# Patient Record
Sex: Female | Born: 1990 | Race: White | Hispanic: No | Marital: Married | Smoking: Never smoker
Health system: Southern US, Community
[De-identification: ages and names within clinical notes are randomized; demographics above are authoritative.]

## PROBLEM LIST (undated history)

## (undated) DIAGNOSIS — Q402 Other specified congenital malformations of stomach: Secondary | ICD-10-CM

## (undated) HISTORY — DX: Other specified congenital malformations of stomach: Q40.2

## (undated) HISTORY — PX: OTHER SURGICAL HISTORY: SHX169

---

## 2020-01-07 NOTE — L&D Delivery Note (Signed)
Delivery Note  0125 In room to see patient, reports increased pelvic pressure with contractions. Coached maternal pushing efforts initiated. Room prepared for second stage.   Spontaneous rupture of membranes at 0130, moderate amount of clear fluid.   Spontaneous vaginal birth of liveborn female infant in left occiput anterior position over intact perineum at 0159. Infant immediately to maternal abdomen. Delayed cord clamping of three (3) vessel cord and tube of cord blood collected. APGARs: 8, 9. Weight pending. Receiving nurse present at bedside for birth.   Patient declines postpartum pitocin. Uterus firm. Spontaneous delivery of intact placenta at 0210. Uterus firm. Rubra small. Perineum intact, swollen. Vault check completed under epidural anesthesia. QBL: 480 ml. Counts correct x 2.   Initiate routine postpartum care and orders. Mom to postpartum.  Baby to Couplet care / Skin to Skin.  FOB present at bedside.    Serafina Royals, CNM [facility], [facility] 07/18/2020, 2:29 AM

## 2020-01-19 ENCOUNTER — Telehealth: Payer: Self-pay

## 2020-01-19 NOTE — Telephone Encounter (Signed)
Informed patient of no visitor policy due to rise in positive COVID cases. Patient was in agreement.

## 2020-01-20 ENCOUNTER — Encounter: Payer: Self-pay | Admitting: Certified Nurse Midwife

## 2020-01-20 ENCOUNTER — Ambulatory Visit (INDEPENDENT_AMBULATORY_CARE_PROVIDER_SITE_OTHER): Payer: 59 | Admitting: Certified Nurse Midwife

## 2020-01-20 ENCOUNTER — Other Ambulatory Visit: Payer: Self-pay

## 2020-01-20 VITALS — BP 132/74 | HR 84 | Ht 64.0 in | Wt 136.8 lb

## 2020-01-20 DIAGNOSIS — N912 Amenorrhea, unspecified: Secondary | ICD-10-CM

## 2020-01-20 DIAGNOSIS — Z3201 Encounter for pregnancy test, result positive: Secondary | ICD-10-CM

## 2020-01-20 LAB — POCT URINE PREGNANCY: Preg Test, Ur: POSITIVE — AB

## 2020-01-20 NOTE — Patient Instructions (Signed)
WHAT OB PATIENTS CAN EXPECT   Confirmation of pregnancy and ultrasound ordered if medically indicated-[redacted] weeks gestation  New OB (NOB) intake with nurse and New OB (NOB) labs- [redacted] weeks gestation  New OB (NOB) physical examination with provider- 11/[redacted] weeks gestation  Flu vaccine-[redacted] weeks gestation  Anatomy scan-[redacted] weeks gestation  Glucose tolerance test, blood work to test for anemia, T-dap vaccine-[redacted] weeks gestation  Vaginal swabs/cultures-STD/Group B strep-[redacted] weeks gestation  Appointments every 4 weeks until 28 weeks  Every 2 weeks from 28 weeks until 36 weeks  Weekly visits from 36 weeks until delivery    Common Medications Safe in Pregnancy  Acne:      Constipation:  Benzoyl Peroxide     Colace  Clindamycin      Dulcolax Suppository  Topica Erythromycin     Fibercon  Salicylic Acid      Metamucil         Miralax AVOID:        Senakot   Accutane    Cough:  Retin-A       Cough Drops  Tetracycline      Phenergan w/ Codeine if Rx  Minocycline      Robitussin (Plain & DM)  Antibiotics:     Crabs/Lice:  Ceclor       RID  Cephalosporins    AVOID:  E-Mycins      Kwell  Keflex  Macrobid/Macrodantin   Diarrhea:  Penicillin      Kao-Pectate  Zithromax      Imodium AD         PUSH FLUIDS AVOID:       Cipro     Fever:  Tetracycline      Tylenol (Regular or Extra  Minocycline       Strength)  Levaquin      Extra Strength-Do not          Exceed 8 tabs/24 hrs Caffeine:        <298m/day (equiv. To 1 cup of coffee or  approx. 3 12 oz sodas)         Gas: Cold/Hayfever:       Gas-X  Benadryl      Mylicon  Claritin       Phazyme  **Claritin-D        Chlor-Trimeton    Headaches:  Dimetapp      ASA-Free Excedrin  Drixoral-Non-Drowsy     Cold Compress  Mucinex (Guaifenasin)     Tylenol (Regular or Extra  Sudafed/Sudafed-12 Hour     Strength)  **Sudafed PE Pseudoephedrine   Tylenol Cold & Sinus     Vicks Vapor Rub  Zyrtec  **AVOID if Problems With Blood  Pressure         Heartburn: Avoid lying down for at least 1 hour after meals  Aciphex      Maalox     Rash:  Milk of Magnesia     Benadryl    Mylanta       1% Hydrocortisone Cream  Pepcid  Pepcid Complete   Sleep Aids:  Prevacid      Ambien   Prilosec       Benadryl  Rolaids       Chamomile Tea  Tums (Limit 4/day)     Unisom         Tylenol PM         Warm milk-add vanilla or  Hemorrhoids:       Sugar for taste  Anusol/Anusol H.C.  (RX:  Analapram 2.5%)  Sugar Substitutes:  Hydrocortisone OTC     Ok in moderation  Preparation H      Tucks        Vaseline lotion applied to tissue with wiping    Herpes:     Throat:  Acyclovir      Oragel  Famvir  Valtrex     Vaccines:         Flu Shot Leg Cramps:       *Gardasil  Benadryl      Hepatitis A         Hepatitis B Nasal Spray:       Pneumovax  Saline Nasal Spray     Polio Booster         Tetanus Nausea:       Tuberculosis test or PPD  Vitamin B6 25 mg TID   AVOID:    Dramamine      *Gardasil  Emetrol       Live Poliovirus  Ginger Root 250 mg QID    MMR (measles, mumps &  High Complex Carbs @ Bedtime    rebella)  Sea Bands-Accupressure    Varicella (Chickenpox)  Unisom 1/2 tab TID     *No known complications           If received before Pain:         Known pregnancy;   Darvocet       Resume series after  Lortab        Delivery  Percocet    Yeast:   Tramadol      Femstat  Tylenol 3      Gyne-lotrimin  Ultram       Monistat  Vicodin           MISC:         All Sunscreens           Hair Coloring/highlights          Insect Repellant's          (Including DEET)         Mystic Tans   Second Trimester of Pregnancy  The second trimester of pregnancy is from week 13 through week 27. This is also called months 4 through 6 of pregnancy. This is often the time when you feel your best. During the second trimester:  Morning sickness is less or has stopped.  You may have more energy.  You may feel hungry more  often. At this time, your unborn baby (fetus) is growing very fast. At the end of the sixth month, the unborn baby may be up to 12 inches long and weigh about 1 pounds. You will likely start to feel the baby move between 16 and 20 weeks of pregnancy. Body changes during your second trimester Your body continues to go through many changes during this time. The changes vary and generally return to normal after the baby is born. Physical changes  You will gain more weight.  You may start to get stretch marks on your hips, belly (abdomen), and breasts.  Your breasts will grow and may hurt.  Dark spots or blotches may develop on your face.  A dark line from your belly button to the pubic area (linea nigra) may appear.  You may have changes in your hair. Health changes  You may have headaches.  You may have heartburn.  You may have trouble pooping (constipation).  You may have hemorrhoids or swollen, bulging veins (varicose veins).  Your   gums may bleed.  You may pee (urinate) more often.  You may have back pain. Follow these instructions at home: Medicines  Take over-the-counter and prescription medicines only as told by your doctor. Some medicines are not safe during pregnancy.  Take a prenatal vitamin that contains at least 600 micrograms (mcg) of folic acid. Eating and drinking  Eat healthy meals that include: ? Fresh fruits and vegetables. ? Whole grains. ? Good sources of protein, such as meat, eggs, or tofu. ? Low-fat dairy products.  Avoid raw meat and unpasteurized juice, milk, and cheese.  You may need to take these actions to prevent or treat trouble pooping: ? Drink enough fluids to keep your pee (urine) pale yellow. ? Eat foods that are high in fiber. These include beans, whole grains, and fresh fruits and vegetables. ? Limit foods that are high in fat and sugar. These include fried or sweet foods. Activity  Exercise only as told by your doctor. Most  people can do their usual exercise during pregnancy. Try to exercise for 30 minutes at least 5 days a week.  Stop exercising if you have pain or cramps in your belly or lower back.  Do not exercise if it is too hot or too humid, or if you are in a place of great height (high altitude).  Avoid heavy lifting.  If you choose to, you may have sex unless your doctor tells you not to. Relieving pain and discomfort  Wear a good support bra if your breasts are sore.  Take warm water baths (sitz baths) to soothe pain or discomfort caused by hemorrhoids. Use hemorrhoid cream if your doctor approves.  Rest with your legs raised (elevated) if you have leg cramps or low back pain.  If you develop bulging veins in your legs: ? Wear support hose as told by your doctor. ? Raise your feet for 15 minutes, 3-4 times a day. ? Limit salt in your food. Safety  Wear your seat belt at all times when you are in a car.  Talk with your doctor if someone is hurting you or yelling at you a lot. Lifestyle  Do not use hot tubs, steam rooms, or saunas.  Do not douche. Do not use tampons or scented sanitary pads.  Avoid cat litter boxes and soil used by cats. These carry germs that can harm your baby and can cause a loss of your baby by miscarriage or stillbirth.  Do not use herbal medicines, illegal drugs, or medicines that are not approved by your doctor. Do not drink alcohol.  Do not smoke or use any products that contain nicotine or tobacco. If you need help quitting, ask your doctor. General instructions  Keep all follow-up visits. This is important.  Ask your doctor about local prenatal classes.  Ask your doctor about the right foods to eat or for help finding a counselor. Where to find more information  American Pregnancy Association: americanpregnancy.org  American College of Obstetricians and Gynecologists: www.acog.org  Office on Women's Health: womenshealth.gov/pregnancy Contact a doctor  if:  You have a headache that does not go away when you take medicine.  You have changes in how you see, or you see spots in front of your eyes.  You have mild cramps, pressure, or pain in your lower belly.  You continue to feel like you may vomit (nauseous), you vomit, or you have watery poop (diarrhea).  You have bad-smelling fluid coming from your vagina.  You have pain when you   pee or your pee smells bad.  You have very bad swelling of your face, hands, ankles, feet, or legs.  You have a fever. Get help right away if:  You are leaking fluid from your vagina.  You have spotting or bleeding from your vagina.  You have very bad belly cramping or pain.  You have trouble breathing.  You have chest pain.  You faint.  You have not felt your baby move for the time period told by your doctor.  You have new or increased pain, swelling, or redness in an arm or leg. Summary  The second trimester of pregnancy is from week 13 through week 27 (months 4 through 6).  Eat healthy meals.  Exercise as told by your doctor. Most people can do their usual exercise during pregnancy.  Do not use herbal medicines, illegal drugs, or medicines that are not approved by your doctor. Do not drink alcohol.  Call your doctor if you get sick or if you notice anything unusual about your pregnancy. This information is not intended to replace advice given to you by your health care provider. Make sure you discuss any questions you have with your health care provider. Document Revised: 06/01/2019 Document Reviewed: 04/07/2019 Elsevier Patient Education  2021 Elsevier Inc.  

## 2020-01-20 NOTE — Progress Notes (Signed)
GYN ENCOUNTER NOTE  Subjective:       Tammy Burgess is a 30 y.o. [phone] female here for pregnancy confirmation.   Nausea and fatigue with are resolving in the second trimester of pregnancy.   Taking a prenatal vitamin with folic acid and DHA.   Relocated here from [place]. History of two (2) previous home birth with midwife. Insurance will only cover a hospital birth in [place].   Denies difficulty breathing or respiratory distress, chest pain, abdominal pain, dysuria, and leg pain or swelling.    Gynecologic History  Patient's last menstrual period was 10/08/2019.   End of ovulation date: 10/23/2019  Gestational age: 45 weeks 6 days  Estimated of birth: 07/16/2020  Contraception: none  Obstetric History  OB History  Gravida Para Term Preterm AB Living  4 2 2   1 2   SAB IAB Ectopic Multiple Live Births  1       2    # Outcome Date GA Lbr Len/2nd Weight Sex Delivery Anes PTL Lv  4 Current           3 Term 04/09/18   7 lb 8 oz (3.402 kg) M Vag-Spont  N LIV  2 Term 09/14/15   6 lb 11 oz (3.033 kg)  Vag-Spont  N LIV  1 SAB 12/03/14            Past Medical History:  Diagnosis Date  . Pyloric atresia     History reviewed. No pertinent surgical history.  Current Outpatient Medications on File Prior to Visit  Medication Sig Dispense Refill  . Magnesium Oxide (MAG-CAPS PO) Take by mouth.    . Prenatal Vit-Fe Fumarate-FA (MULTIVITAMIN-PRENATAL) 27-0.8 MG TABS tablet Take 1 tablet by mouth daily at 12 noon.     No current facility-administered medications on file prior to visit.    Allergies  Allergen Reactions  . Latex Rash    Social History   Socioeconomic History  . Marital status: Married    Spouse name: Not on file  . Number of children: Not on file  . Years of education: Not on file  . Highest education level: Not on file  Occupational History  . Not on file  Tobacco Use  . Smoking status: Never Smoker  . Smokeless tobacco: Never Used   Substance and Sexual Activity  . Alcohol use: Not Currently  . Drug use: Never  . Sexual activity: Yes    Partners: Male    Birth control/protection: None  Other Topics Concern  . Not on file  Social History Narrative  . Not on file   Social Determinants of Health   Financial Resource Strain: Not on file  Food Insecurity: Not on file  Transportation Needs: Not on file  Physical Activity: Not on file  Stress: Not on file  Social Connections: Not on file  Intimate Partner Violence: Not on file    Family History  Problem Relation Age of Onset  . Hypertension Mother     The following portions of the patient's history were reviewed and updated as appropriate: allergies, current medications, past family history, past medical history, past social history, past surgical history and problem list.  Review of Systems  ROS negative except as noted above. Information obtained from patient.   Objective:   BP (!) 152/70   Pulse 84   Ht 5\' 4"  (1.626 m)   Wt 136 lb 12.8 oz (62.1 kg)   LMP 10/08/2019   BMI 23.48 kg/m  CONSTITUTIONAL: Well-developed, well-nourished female in no acute distress.   ABDOMEN: FHR 145 bpm, four (4) fingerbreadths below the umbilicus.  Recent Results (from the past 2160 hour(s))  POCT urine pregnancy     Status: Abnormal   Collection Time: 01/20/20  3:50 PM  Result Value Ref Range   Preg Test, Ur Positive (A) Negative   Assessment:   1. Amenorrhea  - POCT urine pregnancy  2. Positive pregnancy test   Plan:   Second trimester education, see AVS.   Discussed practice and hospital policies.   Reviewed red flag symptoms and when to call.   RTC x 1-2 week for intake and NOB PE or sooner if needed.    Serafina Royals, CNM [facility], [facility]

## 2020-02-10 ENCOUNTER — Other Ambulatory Visit: Payer: Self-pay

## 2020-02-10 ENCOUNTER — Ambulatory Visit (INDEPENDENT_AMBULATORY_CARE_PROVIDER_SITE_OTHER): Payer: 59 | Admitting: Certified Nurse Midwife

## 2020-02-10 VITALS — BP 134/83 | HR 85 | Ht 64.0 in | Wt 139.7 lb

## 2020-02-10 DIAGNOSIS — Z113 Encounter for screening for infections with a predominantly sexual mode of transmission: Secondary | ICD-10-CM | POA: Diagnosis not present

## 2020-02-10 DIAGNOSIS — Z0283 Encounter for blood-alcohol and blood-drug test: Secondary | ICD-10-CM | POA: Diagnosis not present

## 2020-02-10 DIAGNOSIS — Z3482 Encounter for supervision of other normal pregnancy, second trimester: Secondary | ICD-10-CM

## 2020-02-10 NOTE — Progress Notes (Signed)
Tammy Burgess presents for NOB nurse intake visit. Pregnancy confirmation done at [facility], 01/20/2020 , with Gunnar Bulla, CNM.  G 4.  P 2012.  LMP 10/08/2019.  EDD 07/14/2020.  Ga [redacted]w[redacted]d . Pregnancy education material explained and given.  0 cats in the home.  NOB labs ordered. BMI greater than 30. TSH/HbgA1c. Sickle cell order due to race. HIV and drug screen explained and ordered. Genetic screening discussed. Genetic testing; Unsure. Pt to discuss genetic testing with provider. PNV encouraged. Pt to follow up with provider in 1 weeks for NOB physical.  FMLA; [facility] Financial Policy; Drug and HIV screening. Pt signed forms and voiced stated that she understood. Pt would like to discuss if she needed to complete some of the labs work due to she was going to have to pay for them out of pocket. Pt was advised to speak with provider during her NOB PE. All NOB labs have been charted and pended.

## 2020-02-10 NOTE — Progress Notes (Signed)
I have reviewed the record and concur with patient management and plan of care.    Tammy Burgess, CNM [facility], [facility] 02/10/20 5:30 PM

## 2020-02-10 NOTE — Patient Instructions (Signed)
[phone] and Pregnancy  Pregnant and recently pregnant women should take steps to stay healthy, including . getting a COVID-19 vaccine  . following guidelines from health officials for when to wear a mask and take other steps to prevent infection . keeping your prenatal and postpartum care visits . talking with an OB-GYN or other health care professional if you have any questions about your health or COVID-19 . calling 911 or going to the hospital right away if you need emergency health care   If you think you may have been exposed to the coronavirus and have a fever or cough, call your ob-gyn or other health care professional for advice. If you have emergency warning signs, call 911 or go to the hospital right away. Emergency warning signs include the following: . Having a hard time breathing or shortness of breath (more than what has been normal for you during pregnancy) . Ongoing pain or pressure in the chest . Sudden confusion . Being unable to respond to others . Blue lips or face . Decreased fetal movement/absent of fetal movement . Fever greater than 100.4  If you go to the hospital, try to call ahead to let them know you are coming so they can prepare. If you have other symptoms that worry you, call your OB-GYN or 911.   If you are diagnosed with COVID-19, follow the advice from the [facility] and your ob-gyn or other health care professional. The current CDC advice for all people with COVID-19 includes the following: . Stay home except to get medical care. Avoid public transportation. Marland Kitchen Speak with your health care team over the phone before going to their office. Get medical care right away if you feel worse or think it's an emergency. . Separate yourself from other people in your home. . Wear a face mask when you are around other people and when you go to get medical care. . Use the safe medication list to treat the symptoms i.e., cough, congestion, sore throat, fever. . If you are  having nausea and are unable to hold down liquids or food contact the office as we can prescribe medication. . Stay hydrate. Frequent sips of water, broth, ice chips, and popsicle. . Small bland meals.  . Hand hygiene- Wash hands frequently and/or use hand sanitizer.  . Wipe down surfaces.    For additional information please visit the web site below.    [email or web address]    WHAT OB PATIENTS CAN EXPECT   Confirmation of pregnancy and ultrasound ordered if medically indicated-[redacted] weeks gestation  New OB (NOB) intake with nurse and New OB (NOB) labs- [redacted] weeks gestation  New OB (NOB) physical examination with provider- 11/[redacted] weeks gestation  Flu vaccine-[redacted] weeks gestation  Anatomy scan-[redacted] weeks gestation  Glucose tolerance test, blood work to test for anemia, T-dap vaccine-[redacted] weeks gestation  Vaginal swabs/cultures-STD/Group B strep-[redacted] weeks gestation  Appointments every 4 weeks until 28 weeks  Every 2 weeks from 28 weeks until 36 weeks  Weekly visits from 36 weeks until delivery  Second Trimester of Pregnancy  The second trimester of pregnancy is from week 13 through week 27. This is also called months 4 through 6 of pregnancy. This is often the time when you feel your best. During the second trimester:  Morning sickness is less or has stopped.  You may have more energy.  You may feel hungry more often. At this time, your unborn baby (fetus) is growing very fast. At the end of the sixth month,  the unborn baby may be up to 12 inches long and weigh about 1 pounds. You will likely start to feel the baby move between 16 and 20 weeks of pregnancy. Body changes during your second trimester Your body continues to go through many changes during this time. The changes vary and generally return to normal after the baby is born. Physical changes  You will gain more weight.  You may start to get stretch marks on your hips, belly (abdomen), and breasts.  Your breasts will  grow and may hurt.  Dark spots or blotches may develop on your face.  A dark line from your belly button to the pubic area (linea nigra) may appear.  You may have changes in your hair. Health changes  You may have headaches.  You may have heartburn.  You may have trouble pooping (constipation).  You may have hemorrhoids or swollen, bulging veins (varicose veins).  Your gums may bleed.  You may pee (urinate) more often.  You may have back pain. Follow these instructions at home: Medicines  Take over-the-counter and prescription medicines only as told by your doctor. Some medicines are not safe during pregnancy.  Take a prenatal vitamin that contains at least 600 micrograms (mcg) of folic acid. Eating and drinking  Eat healthy meals that include: ? Fresh fruits and vegetables. ? Whole grains. ? Good sources of protein, such as meat, eggs, or tofu. ? Low-fat dairy products.  Avoid raw meat and unpasteurized juice, milk, and cheese.  You may need to take these actions to prevent or treat trouble pooping: ? Drink enough fluids to keep your pee (urine) pale yellow. ? Eat foods that are high in fiber. These include beans, whole grains, and fresh fruits and vegetables. ? Limit foods that are high in fat and sugar. These include fried or sweet foods. Activity  Exercise only as told by your doctor. Most people can do their usual exercise during pregnancy. Try to exercise for 30 minutes at least 5 days a week.  Stop exercising if you have pain or cramps in your belly or lower back.  Do not exercise if it is too hot or too humid, or if you are in a place of great height (high altitude).  Avoid heavy lifting.  If you choose to, you may have sex unless your doctor tells you not to. Relieving pain and discomfort  Wear a good support bra if your breasts are sore.  Take warm water baths (sitz baths) to soothe pain or discomfort caused by hemorrhoids. Use hemorrhoid cream if  your doctor approves.  Rest with your legs raised (elevated) if you have leg cramps or low back pain.  If you develop bulging veins in your legs: ? Wear support hose as told by your doctor. ? Raise your feet for 15 minutes, 3-4 times a day. ? Limit salt in your food. Safety  Wear your seat belt at all times when you are in a car.  Talk with your doctor if someone is hurting you or yelling at you a lot. Lifestyle  Do not use hot tubs, steam rooms, or saunas.  Do not douche. Do not use tampons or scented sanitary pads.  Avoid cat litter boxes and soil used by cats. These carry germs that can harm your baby and can cause a loss of your baby by miscarriage or stillbirth.  Do not use herbal medicines, illegal drugs, or medicines that are not approved by your doctor. Do not drink alcohol.  Do not smoke or use any products that contain nicotine or tobacco. If you need help quitting, ask your doctor. General instructions  Keep all follow-up visits. This is important.  Ask your doctor about local prenatal classes.  Ask your doctor about the right foods to eat or for help finding a counselor. Where to find more information  American Pregnancy Association: americanpregnancy.org  [employer]: www.acog.org  Office on [employer]: KeywordPortfolios.com.br Contact a doctor if:  You have a headache that does not go away when you take medicine.  You have changes in how you see, or you see spots in front of your eyes.  You have mild cramps, pressure, or pain in your lower belly.  You continue to feel like you may vomit (nauseous), you vomit, or you have watery poop (diarrhea).  You have bad-smelling fluid coming from your vagina.  You have pain when you pee or your pee smells bad.  You have very bad swelling of your face, hands, ankles, feet, or legs.  You have a fever. Get help right away if:  You are leaking fluid from your  vagina.  You have spotting or bleeding from your vagina.  You have very bad belly cramping or pain.  You have trouble breathing.  You have chest pain.  You faint.  You have not felt your baby move for the time period told by your doctor.  You have new or increased pain, swelling, or redness in an arm or leg. Summary  The second trimester of pregnancy is from week 13 through week 27 (months 4 through 6).  Eat healthy meals.  Exercise as told by your doctor. Most people can do their usual exercise during pregnancy.  Do not use herbal medicines, illegal drugs, or medicines that are not approved by your doctor. Do not drink alcohol.  Call your doctor if you get sick or if you notice anything unusual about your pregnancy. This information is not intended to replace advice given to you by your health care provider. Make sure you discuss any questions you have with your health care provider. Document Revised: 06/01/2019 Document Reviewed: 04/07/2019 Elsevier Patient Education  2021 [employer]. How a Baby Grows During Pregnancy Pregnancy begins when a female's sperm enters a female's egg. This is called fertilization. Fertilization usually happens in one of the fallopian tubes that connect the ovaries to the uterus. The fertilized egg moves down the fallopian tube to the uterus. Once it reaches the uterus, it implants into the lining of the uterus and begins to grow. For the first 8 weeks, the fertilized egg is called an embryo. After 8 weeks, it is called a fetus. As the fetus continues to grow, it receives oxygen and nutrients through the placenta, which is an organ that grows to support the developing baby. The placenta is the life support system for the baby. It provides oxygen and nutrition and removes waste. How long does a typical pregnancy last? A pregnancy usually lasts 280 days, or about 40 weeks. Pregnancy is divided into three periods of growth, also called trimesters:  First  trimester: 0-12 weeks.  Second trimester: 13-27 weeks.  Third trimester: 28-40 weeks. The day when your baby is ready to be born (full term) is your estimated date of delivery. However, most babies are not born on their estimated date of delivery. How does my baby develop month by month? First month  The fertilized egg attaches to the inside of the uterus.  Some cells will form the placenta. Others will form the fetus.  The arms, legs, brain, spinal cord, lungs, and heart begin to develop.  At the end of the first month, the heart begins to beat. Second month  The bones, inner ear, eyelids, hands, and feet form.  The genitals develop.  By the end of 8 weeks, all major organs are developing. Third month  All of the internal organs are forming.  Teeth develop below the gums.  Bones and muscles begin to grow. The spine can flex.  The skin is transparent.  Fingernails and toenails begin to form.  Arms and legs continue to grow longer, and hands and feet develop.  The fetus is about 3 inches (7.6 cm) long. Fourth month  The placenta is completely formed.  The external sex organs, neck, outer ear, eyebrows, eyelids, and fingernails are formed.  The fetus can hear, swallow, and move its arms and legs.  The kidneys begin to produce urine.  The skin is covered with a white, waxy coating (vernix) and very fine hair (lanugo). Fifth month  The fetus moves around more and can be felt for the first time (quickening).  The fetus starts to sleep and wake up and may begin to suck a finger.  The nails grow to the end of the fingers.  The organ in the digestive system that makes bile (gallbladder) functions and helps to digest nutrients.  If the fetus is a female, eggs are present in the ovaries. If the fetus is a female, testicles start to move down into the scrotum. Sixth month  The lungs are formed.  The eyes open. The brain continues to develop.  Your baby has  fingerprints and toe prints. Your baby's hair grows thicker.  At the end of the second trimester, the fetus is about 9 inches (22.9 cm) long. Seventh month  The fetus kicks and stretches.  The eyes are developed enough to sense changes in light.  The hands can make a grasping motion.  The fetus responds to sound. Eighth month  Most organs and body systems are fully developed and functioning.  Bones harden, and taste buds develop. The fetus may hiccup.  Certain areas of the brain are still developing. The skull remains soft. Ninth month  The fetus gains about  lb (0.23 kg) each week.  The lungs are fully developed.  Patterns of sleep develop.  The fetus's head typically moves into a head-down position (vertex) in the uterus to prepare for birth.  The fetus weighs 6-9 lb (2.72-4.08 kg) and is 19-20 inches (48.26-50.8 cm) long.   How do I know if my baby is developing well? Always talk with your health care provider about any concerns that you may have about your pregnancy and your baby. At each prenatal visit, your health care provider will do several different tests to check on your health and keep track of your baby's development. These include:  Fundal height and position. To do this, your health care provider will: ? Measure your growing belly from your pubic bone to the top of the uterus using a tape measure. ? Feel your belly to determine your baby's position.  Heartbeat. An ultrasound in the first trimester can confirm pregnancy and show a heartbeat, depending on how far along you are. Your health care provider will check your baby's heart rate at every prenatal visit. You will also have a second trimester ultrasound to check your baby's development. Follow these instructions at home:  Take prenatal vitamins as told by your health care provider. These include vitamins such as folic acid, iron, calcium, and vitamin D. They are important for healthy development.  Take  over-the-counter and prescription medicines only as told by your health care provider.  Keep all follow-up visits. This is important. Follow-up visits include prenatal care and screening tests. Summary  A pregnancy usually lasts 280 days, or about 40 weeks. Pregnancy is divided into three periods of growth, also called trimesters.  Your health care provider will monitor your baby's growth and development throughout your pregnancy.  Follow your health care provider's recommendations about taking prenatal vitamins and medicines during your pregnancy.  Talk with your health care provider if you have any concerns about your pregnancy or your developing baby. This information is not intended to replace advice given to you by your health care provider. Make sure you discuss any questions you have with your health care provider. Document Revised: 06/01/2019 Document Reviewed: 04/07/2019 Elsevier Patient Education  2021 [employer]. Commonly Asked Questions During Pregnancy  Cats: A parasite can be excreted in cat feces.  To avoid exposure you need to have another person empty the little box.  If you must empty the litter box you will need to wear gloves.  Wash your hands after handling your cat.  This parasite can also be found in raw or undercooked meat so this should also be avoided.  Colds, Sore Throats, Flu: Please check your medication sheet to see what you can take for symptoms.  If your symptoms are unrelieved by these medications please call the office.  Dental Work: Most any dental work Investment banker, corporate recommends is permitted.  X-rays should only be taken during the first trimester if absolutely necessary.  Your abdomen should be shielded with a lead apron during all x-rays.  Please notify your provider prior to receiving any x-rays.  Novocaine is fine; gas is not recommended.  If your dentist requires a note from [place] prior to dental work please call the office and we will provide one for  you.  Exercise: Exercise is an important part of staying healthy during your pregnancy.  You may continue most exercises you were accustomed to prior to pregnancy.  Later in your pregnancy you will most likely notice you have difficulty with activities requiring balance like riding a bicycle.  It is important that you listen to your body and avoid activities that put you at a higher risk of falling.  Adequate rest and staying well hydrated are a must!  If you have questions about the safety of specific activities ask your provider.    Exposure to Children with illness: Try to avoid obvious exposure; report any symptoms to [place] when noted,  If you have chicken pos, red measles or mumps, you should be immune to these diseases.   Please do not take any vaccines while pregnant unless you have checked with your OB provider.  Fetal Movement: After 28 weeks we recommend you do "kick counts" twice daily.  Lie or sit down in a calm quiet environment and count your baby movements "kicks".  You should feel your baby at least 10 times per hour.  If you have not felt 10 kicks within the first hour get up, walk around and have something sweet to eat or drink then repeat for an additional hour.  If count remains less than 10 per hour notify your provider.  Fumigating: Follow your pest control agent's advice as to how long to  stay out of your home.  Ventilate the area well before re-entering.  Hemorrhoids:   Most over-the-counter preparations can be used during pregnancy.  Check your medication to see what is safe to use.  It is important to use a stool softener or fiber in your diet and to drink lots of liquids.  If hemorrhoids seem to be getting worse please call the office.   Hot Tubs:  Hot tubs Jacuzzis and saunas are not recommended while pregnant.  These increase your internal body temperature and should be avoided.  Intercourse:  Sexual intercourse is safe during pregnancy as long as you are comfortable, unless  otherwise advised by your provider.  Spotting may occur after intercourse; report any bright red bleeding that is heavier than spotting.  Labor:  If you know that you are in labor, please go to the hospital.  If you are unsure, please call the office and let us help you decide what to do.  Lifting, straining, etc:  If your job requires heavy lifting or straining please check with your provider for any limitations.  Generally, you should not lift items heavier than that you can lift simply with your hands and arms (no back muscles)  Painting:  Paint fumes do not harm your pregnancy, but may make you ill and should be avoided if possible.  Latex or water based paints have less odor than oils.  Use adequate ventilation while painting.  Permanents & Hair Color:  Chemicals in hair dyes are not recommended as they cause increase hair dryness which can increase hair loss during pregnancy.  " Highlighting" and permanents are allowed.  Dye may be absorbed differently and permanents may not hold as well during pregnancy.  Sunbathing:  Use a sunscreen, as skin burns easily during pregnancy.  Drink plenty of fluids; avoid over heating.  Tanning Beds:  Because their possible side effects are still unknown, tanning beds are not recommended.  Ultrasound Scans:  Routine ultrasounds are performed at approximately 20 weeks.  You will be able to see your baby's general anatomy an if you would like to know the gender this can usually be determined as well.  If it is questionable when you conceived you may also receive an ultrasound early in your pregnancy for dating purposes.  Otherwise ultrasound exams are not routinely performed unless there is a medical necessity.  Although you can request a scan we ask that you pay for it when conducted because insurance does not cover " patient request" scans.  Work: If your pregnancy proceeds without complications you may work until your due date, unless your physician or employer  advises otherwise.  Round Ligament Pain/Pelvic Discomfort:  Sharp, shooting pains not associated with bleeding are fairly common, usually occurring in the second trimester of pregnancy.  They tend to be worse when standing up or when you remain standing for long periods of time.  These are the result of pressure of certain pelvic ligaments called "round ligaments".  Rest, Tylenol and heat seem to be the most effective relief.  As the womb and fetus grow, they rise out of the pelvis and the discomfort improves.  Please notify the office if your pain seems different than that described.  It may represent a more serious condition.  Common Medications Safe in Pregnancy  Acne:      Constipation:  Benzoyl Peroxide     Colace  Clindamycin      Dulcolax Suppository  Topica Erythromycin     Fibercon  Metamucil         Miralax AVOID:        Senakot   Accutane    Cough:  Retin-A       Cough Drops  Tetracycline      Phenergan w/ Codeine if Rx  Minocycline      Robitussin (Plain & DM)  Antibiotics:     Crabs/Lice:  Ceclor       RID  Cephalosporins    AVOID:  E-Mycins      Kwell  Keflex  Macrobid/Macrodantin   Diarrhea:  Penicillin      Kao-Pectate  Zithromax      Imodium AD         PUSH FLUIDS AVOID:       Cipro     Fever:  Tetracycline      Tylenol (Regular or Extra  Minocycline       Strength)  Levaquin      Extra Strength-Do not          Exceed 8 tabs/24 hrs Caffeine:        <200mg/day (equiv. To 1 cup of coffee or  approx. 3 12 oz sodas)         Gas: Cold/Hayfever:       Gas-X  Benadryl      Mylicon  Claritin       Phazyme  **Claritin-D        Chlor-Trimeton    Headaches:  Dimetapp      ASA-Free Excedrin  Drixoral-Non-Drowsy     Cold Compress  Mucinex (Guaifenasin)     Tylenol (Regular or Extra  Sudafed/Sudafed-12 Hour     Strength)  **Sudafed PE Pseudoephedrine   Tylenol Cold & Sinus     Vicks Vapor Rub  Zyrtec  **AVOID if Problems With Blood  Pressure         Heartburn: Avoid lying down for at least 1 hour after meals  Aciphex      Maalox     Rash:  Milk of Magnesia     Benadryl    Mylanta       1% Hydrocortisone Cream  Pepcid  Pepcid Complete   Sleep Aids:  Prevacid      Ambien   Prilosec       Benadryl  Rolaids       Chamomile Tea  Tums (Limit 4/day)     Unisom         Tylenol PM         Warm milk-add vanilla or  Hemorrhoids:       Sugar for taste  Anusol/Anusol H.C.  (RX: Analapram 2.5%)  Sugar Substitutes:  Hydrocortisone OTC     Ok in moderation  Preparation H      Tucks        Vaseline lotion applied to tissue with wiping    Herpes:     Throat:  Acyclovir      Oragel  Famvir  Valtrex     Vaccines:         Flu Shot Leg Cramps:       *Gardasil  Benadryl      Hepatitis A         Hepatitis B Nasal Spray:       Pneumovax  Saline Nasal Spray     Polio Booster         Tetanus Nausea:       Tuberculosis test or PPD  Vitamin B6 25 mg TID   AVOID:      Dramamine      *Gardasil  Emetrol       Live Poliovirus  Ginger Root 250 mg QID    MMR (measles, mumps &  High Complex Carbs @ Bedtime    rebella)  Sea Bands-Accupressure    Varicella (Chickenpox)  Unisom 1/2 tab TID     *No known complications           If received before Pain:         Known pregnancy;   Darvocet       Resume series after  Lortab        Delivery  Percocet    Yeast:   Tramadol      Femstat  Tylenol 3      Gyne-lotrimin  Ultram       Monistat  Vicodin           MISC:         All Sunscreens           Hair Coloring/highlights          Insect Repellant's          (Including DEET)         Mystic Tans  

## 2020-02-13 ENCOUNTER — Ambulatory Visit (INDEPENDENT_AMBULATORY_CARE_PROVIDER_SITE_OTHER): Payer: 59 | Admitting: Certified Nurse Midwife

## 2020-02-13 ENCOUNTER — Other Ambulatory Visit: Payer: 59

## 2020-02-13 ENCOUNTER — Other Ambulatory Visit: Payer: Self-pay

## 2020-02-13 ENCOUNTER — Encounter: Payer: 59 | Admitting: Certified Nurse Midwife

## 2020-02-13 VITALS — BP 133/69 | HR 87 | Wt 140.8 lb

## 2020-02-13 DIAGNOSIS — Z3A18 18 weeks gestation of pregnancy: Secondary | ICD-10-CM

## 2020-02-13 DIAGNOSIS — Z3689 Encounter for other specified antenatal screening: Secondary | ICD-10-CM

## 2020-02-13 DIAGNOSIS — Z130 Encounter for screening for diseases of the blood and blood-forming organs and certain disorders involving the immune mechanism: Secondary | ICD-10-CM

## 2020-02-13 DIAGNOSIS — Z3482 Encounter for supervision of other normal pregnancy, second trimester: Secondary | ICD-10-CM

## 2020-02-13 DIAGNOSIS — Z3490 Encounter for supervision of normal pregnancy, unspecified, unspecified trimester: Secondary | ICD-10-CM | POA: Insufficient documentation

## 2020-02-13 DIAGNOSIS — O0930 Supervision of pregnancy with insufficient antenatal care, unspecified trimester: Secondary | ICD-10-CM

## 2020-02-13 LAB — POCT URINALYSIS DIPSTICK OB
Glucose, UA: NEGATIVE
POC,PROTEIN,UA: NEGATIVE

## 2020-02-13 NOTE — Progress Notes (Signed)
NEW OB HISTORY AND PHYSICAL  SUBJECTIVE:       Tammy Burgess is a 30 y.o. [phone] female, Patient's last menstrual period was 10/08/2019., Estimated Date of Delivery: 07/14/20, [redacted]w[redacted]d, presents today for establishment of Prenatal Care.  She has no unusual complaints.  Reports mild inconsistent nausea without vomiting, mild back pain, and round ligament pain that is relieved by home treatment measures.  Denies difficulty breathing, respiratory distress, chest pain, vaginal bleeding, and leg swelling or pain.  Gynecologic History  Patient's last menstrual period was 10/08/2019. Normal   Contraception: none  Last Pap: due . Declined  Obstetric History  OB History  Gravida Para Term Preterm AB Living  4 2 2   1 2   SAB IAB Ectopic Multiple Live Births  1       2    # Outcome Date GA Lbr Len/2nd Weight Sex Delivery Anes PTL Lv  4 Current           3 Term 04/29/18   3402 g M Vag-Spont  N LIV  2 Term 09/14/15   3033 g F Vag-Spont  N LIV  1 SAB 12/03/14            Past Medical History:  Diagnosis Date  . Pyloric atresia     Past Surgical History:  Procedure Laterality Date  . pyloric atresia surgery      Current Outpatient Medications on File Prior to Visit  Medication Sig Dispense Refill  . Magnesium Oxide POWD Take 1 Scoop by mouth 2 (two) times daily. High absorption Magnesium powder lysinate gylcinate 100% chelated    . Prenatal Vit-Fe Fumarate-FA (MULTIVITAMIN-PRENATAL) 27-0.8 MG TABS tablet Take 1 tablet by mouth daily at 12 noon.     No current facility-administered medications on file prior to visit.    Allergies  Allergen Reactions  . Latex Rash    Social History   Socioeconomic History  . Marital status: Married    Spouse name: Not on file  . Number of children: Not on file  . Years of education: Not on file  . Highest education level: Not on file  Occupational History  . Not on file  Tobacco Use  . Smoking status: Never Smoker  . Smokeless  tobacco: Never Used  Vaping Use  . Vaping Use: Never used  Substance and Sexual Activity  . Alcohol use: Not Currently  . Drug use: Never  . Sexual activity: Yes    Partners: Male    Birth control/protection: None  Other Topics Concern  . Not on file  Social History Narrative  . Not on file   Social Determinants of Health   Financial Resource Strain: Not on file  Food Insecurity: Not on file  Transportation Needs: Not on file  Physical Activity: Not on file  Stress: Not on file  Social Connections: Not on file  Intimate Partner Violence: Not on file    Family History  Problem Relation Age of Onset  . Healthy Mother   . Healthy Father     The following portions of the patient's history were reviewed and updated as appropriate: allergies, current medications, past OB history, past medical history, past surgical history, past family history, past social history, and problem list.  REVIEW OF SYSTEMS  ROS- Negative other than what was reported above. Information obtained verbally from patient.   OBJECTIVE:  BP 133/69   Pulse 87   Wt 140 lb 12.8 oz (63.9 kg)   LMP 10/08/2019   BMI  24.17 kg/m   Initial Physical Exam (New OB)  GENERAL APPEARANCE: alert, well appearing, in no apparent distress, oriented to person, place and time  HEAD: normocephalic, atraumatic  THYROID: no thyromegaly or masses present  BREASTS: patient declined exam  LUNGS: clear to auscultation, no wheezes, rales or rhonchi, symmetric air entry  HEART: regular rate and rhythm, no murmurs  ABDOMEN: soft, nontender, nondistended, no abnormal masses, no epigastric pain, fundus soft, nontender 18 weeks size and FHT present  EXTREMITIES: no edema  NEUROLOGIC: alert, oriented, normal speech, no focal findings or movement disorder noted  PELVIC EXAM: Patient declined exam  ASSESSMENT:  Normal pregnancy  [redacted] weeks gestation  Screening, iron deficiency anemia  Late prenatal  care  PLAN:  Prenatal care  Declined pap today  Lab today, see orders. Agrees to CBC, ABO and Rh, Antibody screen, Rubella and Varicella titers today. Desires Hep B/C, RPR, HIV, and GC/Ch at 36 weeks.   Answered questions and concerns, reassurance provided.  Anticipatory guidance regarding course of prenatal care.   Reviewed red flag symptoms and when to call  RTC x 2-3 weeks for ANATOMY scan and ROB with ANNIE or sooner if needed.  Juliann Pares, Student-MidWife [employer] 02/13/20 4:31 PM

## 2020-02-13 NOTE — Patient Instructions (Signed)
Round Ligament Pain  The round ligament is a cord of muscle and tissue that helps support the uterus. It can become a source of pain during pregnancy if it becomes stretched or twisted as the baby grows. The pain usually begins in the second trimester (13-28 weeks) of pregnancy, and it can come and go until the baby is delivered. It is not a serious problem, and it does not cause harm to the baby. Round ligament pain is usually a short, sharp, and pinching pain, but it can also be a dull, lingering, and aching pain. The pain is felt in the lower side of the abdomen or in the groin. It usually starts deep in the groin and moves up to the outside of the hip area. The pain may occur when you:  Suddenly change position, such as quickly going from a sitting to standing position.  Roll over in bed.  Cough or sneeze.  Do physical activity. Follow these instructions at home:  Watch your condition for any changes.  When the pain starts, relax. Then try any of these methods to help with the pain: ? Sitting down. ? Flexing your knees up to your abdomen. ? Lying on your side with one pillow under your abdomen and another pillow between your legs. ? Sitting in a warm bath for 15-20 minutes or until the pain goes away.  Take over-the-counter and prescription medicines only as told by your health care provider.  Move slowly when you sit down or stand up.  Avoid long walks if they cause pain.  Stop or reduce your physical activities if they cause pain.  Keep all follow-up visits as told by your health care provider. This is important.   Contact a health care provider if:  Your pain does not go away with treatment.  You feel pain in your back that you did not have before.  Your medicine is not helping. Get help right away if:  You have a fever or chills.  You develop uterine contractions.  You have vaginal bleeding.  You have nausea or vomiting.  You have diarrhea.  You have pain  when you urinate. Summary  Round ligament pain is felt in the lower abdomen or groin. It is usually a short, sharp, and pinching pain. It can also be a dull, lingering, and aching pain.  This pain usually begins in the second trimester (13-28 weeks). It occurs because the uterus is stretching with the growing baby, and it is not harmful to the baby.  You may notice the pain when you suddenly change position, when you cough or sneeze, or during physical activity.  Relaxing, flexing your knees to your abdomen, lying on one side, or taking a warm bath may help to get rid of the pain.  Get help from your health care provider if the pain does not go away or if you have vaginal bleeding, nausea, vomiting, diarrhea, or painful urination. This information is not intended to replace advice given to you by your health care provider. Make sure you discuss any questions you have with your health care provider. Document Revised: 06/10/2017 Document Reviewed: 06/10/2017 Elsevier Patient Education  2021 Elsevier Inc.     Second Trimester of Pregnancy  The second trimester of pregnancy is from week 13 through week 27. This is also called months 4 through 6 of pregnancy. This is often the time when you feel your best. During the second trimester:  Morning sickness is less or has stopped.  You   may have more energy.  You may feel hungry more often. At this time, your unborn baby (fetus) is growing very fast. At the end of the sixth month, the unborn baby may be up to 12 inches long and weigh about 1 pounds. You will likely start to feel the baby move between 16 and 20 weeks of pregnancy. Body changes during your second trimester Your body continues to go through many changes during this time. The changes vary and generally return to normal after the baby is born. Physical changes  You will gain more weight.  You may start to get stretch marks on your hips, belly (abdomen), and breasts.  Your  breasts will grow and may hurt.  Dark spots or blotches may develop on your face.  A dark line from your belly button to the pubic area (linea nigra) may appear.  You may have changes in your hair. Health changes  You may have headaches.  You may have heartburn.  You may have trouble pooping (constipation).  You may have hemorrhoids or swollen, bulging veins (varicose veins).  Your gums may bleed.  You may pee (urinate) more often.  You may have back pain. Follow these instructions at home: Medicines  Take over-the-counter and prescription medicines only as told by your doctor. Some medicines are not safe during pregnancy.  Take a prenatal vitamin that contains at least 600 micrograms (mcg) of folic acid. Eating and drinking  Eat healthy meals that include: ? Fresh fruits and vegetables. ? Whole grains. ? Good sources of protein, such as meat, eggs, or tofu. ? Low-fat dairy products.  Avoid raw meat and unpasteurized juice, milk, and cheese.  You may need to take these actions to prevent or treat trouble pooping: ? Drink enough fluids to keep your pee (urine) pale yellow. ? Eat foods that are high in fiber. These include beans, whole grains, and fresh fruits and vegetables. ? Limit foods that are high in fat and sugar. These include fried or sweet foods. Activity  Exercise only as told by your doctor. Most people can do their usual exercise during pregnancy. Try to exercise for 30 minutes at least 5 days a week.  Stop exercising if you have pain or cramps in your belly or lower back.  Do not exercise if it is too hot or too humid, or if you are in a place of great height (high altitude).  Avoid heavy lifting.  If you choose to, you may have sex unless your doctor tells you not to. Relieving pain and discomfort  Wear a good support bra if your breasts are sore.  Take warm water baths (sitz baths) to soothe pain or discomfort caused by hemorrhoids. Use  hemorrhoid cream if your doctor approves.  Rest with your legs raised (elevated) if you have leg cramps or low back pain.  If you develop bulging veins in your legs: ? Wear support hose as told by your doctor. ? Raise your feet for 15 minutes, 3-4 times a day. ? Limit salt in your food. Safety  Wear your seat belt at all times when you are in a car.  Talk with your doctor if someone is hurting you or yelling at you a lot. Lifestyle  Do not use hot tubs, steam rooms, or saunas.  Do not douche. Do not use tampons or scented sanitary pads.  Avoid cat litter boxes and soil used by cats. These carry germs that can harm your baby and can cause a loss   of your baby by miscarriage or stillbirth.  Do not use herbal medicines, illegal drugs, or medicines that are not approved by your doctor. Do not drink alcohol.  Do not smoke or use any products that contain nicotine or tobacco. If you need help quitting, ask your doctor. General instructions  Keep all follow-up visits. This is important.  Ask your doctor about local prenatal classes.  Ask your doctor about the right foods to eat or for help finding a counselor. Where to find more information  American Pregnancy Association: americanpregnancy.org  American College of Obstetricians and Gynecologists: www.acog.org  Office on Women's Health: womenshealth.gov/pregnancy Contact a doctor if:  You have a headache that does not go away when you take medicine.  You have changes in how you see, or you see spots in front of your eyes.  You have mild cramps, pressure, or pain in your lower belly.  You continue to feel like you may vomit (nauseous), you vomit, or you have watery poop (diarrhea).  You have bad-smelling fluid coming from your vagina.  You have pain when you pee or your pee smells bad.  You have very bad swelling of your face, hands, ankles, feet, or legs.  You have a fever. Get help right away if:  You are leaking  fluid from your vagina.  You have spotting or bleeding from your vagina.  You have very bad belly cramping or pain.  You have trouble breathing.  You have chest pain.  You faint.  You have not felt your baby move for the time period told by your doctor.  You have new or increased pain, swelling, or redness in an arm or leg. Summary  The second trimester of pregnancy is from week 13 through week 27 (months 4 through 6).  Eat healthy meals.  Exercise as told by your doctor. Most people can do their usual exercise during pregnancy.  Do not use herbal medicines, illegal drugs, or medicines that are not approved by your doctor. Do not drink alcohol.  Call your doctor if you get sick or if you notice anything unusual about your pregnancy. This information is not intended to replace advice given to you by your health care provider. Make sure you discuss any questions you have with your health care provider. Document Revised: 06/01/2019 Document Reviewed: 04/07/2019 Elsevier Patient Education  2021 Elsevier Inc.  

## 2020-02-13 NOTE — Progress Notes (Signed)
I have seen, interviewed, and examined the patient in conjunction with the [employer] and affirm the diagnosis and management plan.   Gunnar Bulla, CNM [facility], [facility] 02/13/20 4:38 PM

## 2020-02-14 LAB — CBC
Hematocrit: 35.1 % (ref 34.0–46.6)
Hemoglobin: 11.9 g/dL (ref 11.1–15.9)
MCH: 28.6 pg (ref 26.6–33.0)
MCHC: 33.9 g/dL (ref 31.5–35.7)
MCV: 84 fL (ref 79–97)
Platelets: 330 10*3/uL (ref 150–450)
RBC: 4.16 x10E6/uL (ref 3.77–5.28)
RDW: 13.1 % (ref 11.7–15.4)
WBC: 12.4 10*3/uL — ABNORMAL HIGH (ref 3.4–10.8)

## 2020-02-14 LAB — ABO AND RH: Rh Factor: POSITIVE

## 2020-02-14 LAB — ANTIBODY SCREEN: Antibody Screen: NEGATIVE

## 2020-02-14 LAB — VARICELLA ZOSTER ANTIBODY, IGG: Varicella zoster IgG: 931 index (ref >165)

## 2020-02-14 LAB — RUBELLA SCREEN: Rubella Antibodies, IGG: <0.9 index — ABNORMAL LOW (ref >0.99)

## 2020-02-15 ENCOUNTER — Encounter: Payer: Self-pay | Admitting: Certified Nurse Midwife

## 2020-02-15 DIAGNOSIS — Z2839 Other underimmunization status: Secondary | ICD-10-CM | POA: Insufficient documentation

## 2020-02-15 DIAGNOSIS — Z6740 Type O blood, Rh positive: Secondary | ICD-10-CM | POA: Insufficient documentation

## 2020-02-15 DIAGNOSIS — Z283 Underimmunization status: Secondary | ICD-10-CM | POA: Insufficient documentation

## 2020-02-24 ENCOUNTER — Telehealth: Payer: Self-pay

## 2020-02-24 NOTE — Telephone Encounter (Signed)
Pt called in and stated that she missed a call about her net appts. The pt sad that the time that it was changed to will not work. The pt was given 3/1 at 2:15 for he anatomy u/s  and her ob to follow after. The pt wanted to know if there was anything later for 3/2 her husband cant come to the 2;15. I told the pt that at her last appt the provider said that she needs to be seen within 2-3 weeks for this visit. The pt was stern with me and was like I need to push that back way cant I? She then stated that she wanted to talk to a Production designer, theatre/television/film. I told the pt I will send a message to her and to allow 24-48 hours for a reply. Please advise

## 2020-02-24 NOTE — Telephone Encounter (Signed)
Pt needed late afternoon appts.   Ok to come for 2 visits. U/s on 2/22. ROB on 2/28.

## 2020-02-28 ENCOUNTER — Other Ambulatory Visit: Payer: 59

## 2020-02-28 ENCOUNTER — Encounter: Payer: 59 | Admitting: Certified Nurse Midwife

## 2020-03-05 ENCOUNTER — Encounter: Payer: 59 | Admitting: Certified Nurse Midwife

## 2020-03-06 ENCOUNTER — Other Ambulatory Visit: Payer: 59

## 2020-03-06 ENCOUNTER — Encounter: Payer: 59 | Admitting: Certified Nurse Midwife

## 2020-03-06 ENCOUNTER — Encounter: Payer: Self-pay | Admitting: Certified Nurse Midwife

## 2020-03-13 ENCOUNTER — Other Ambulatory Visit: Payer: Self-pay

## 2020-03-13 ENCOUNTER — Ambulatory Visit (INDEPENDENT_AMBULATORY_CARE_PROVIDER_SITE_OTHER): Payer: 59 | Admitting: Certified Nurse Midwife

## 2020-03-13 ENCOUNTER — Other Ambulatory Visit: Payer: 59

## 2020-03-13 ENCOUNTER — Encounter: Payer: Self-pay | Admitting: Certified Nurse Midwife

## 2020-03-13 VITALS — BP 115/75 | HR 73 | Wt 148.5 lb

## 2020-03-13 DIAGNOSIS — Z3A22 22 weeks gestation of pregnancy: Secondary | ICD-10-CM

## 2020-03-13 DIAGNOSIS — Z3402 Encounter for supervision of normal first pregnancy, second trimester: Secondary | ICD-10-CM

## 2020-03-13 LAB — POCT URINALYSIS DIPSTICK OB
Appearance: NORMAL
Bilirubin, UA: NEGATIVE
Blood, UA: NEGATIVE
Glucose, UA: NEGATIVE
Ketones, UA: NEGATIVE
Nitrite, UA: NEGATIVE
Odor: NORMAL
POC,PROTEIN,UA: NEGATIVE
Spec Grav, UA: 1.01 (ref 1.010–1.025)
Urobilinogen, UA: 0.2 E.U./dL
pH, UA: 6 (ref 5.0–8.0)

## 2020-03-13 NOTE — Patient Instructions (Signed)

## 2020-03-13 NOTE — Progress Notes (Signed)
ROB doing well. She is feeling fetal movement but not as much as she thought she would. Discussed probable anterior placenta. She verbalizes understanding. Has not has anatomy u/s yet it is scheduled for the 3/22. Discussed round ligament pain and normal discomforts of pregnancy and self help measures. She asked about glucose screening. She is considering doing the home monitoring for a week. Discussed alternative options to the Glucola drink vs Glucola vs home monitoring. She also asked about not doing screening at all. Discussed that she has the right to refuse any testing . She will let us know at the next visit what she decides. Follow up 3 wk with Marcelino Duster for ROB.   Doreene Burke, CNM

## 2020-03-27 ENCOUNTER — Ambulatory Visit
Admission: RE | Admit: 2020-03-27 | Discharge: 2020-03-27 | Disposition: A | Discharge status: Home / Self Care | Payer: 59 | Source: Ambulatory Visit | Attending: Certified Nurse Midwife | Admitting: Certified Nurse Midwife

## 2020-03-27 ENCOUNTER — Other Ambulatory Visit: Payer: Self-pay

## 2020-03-27 DIAGNOSIS — Z3482 Encounter for supervision of other normal pregnancy, second trimester: Secondary | ICD-10-CM | POA: Diagnosis not present

## 2020-03-27 DIAGNOSIS — Z3689 Encounter for other specified antenatal screening: Secondary | ICD-10-CM | POA: Diagnosis present

## 2020-03-30 ENCOUNTER — Other Ambulatory Visit: Payer: Self-pay

## 2020-03-30 ENCOUNTER — Ambulatory Visit (INDEPENDENT_AMBULATORY_CARE_PROVIDER_SITE_OTHER): Payer: 59 | Admitting: Certified Nurse Midwife

## 2020-03-30 ENCOUNTER — Encounter: Payer: Self-pay | Admitting: Certified Nurse Midwife

## 2020-03-30 VITALS — BP 119/72 | HR 76 | Wt 150.9 lb

## 2020-03-30 DIAGNOSIS — Z3402 Encounter for supervision of normal first pregnancy, second trimester: Secondary | ICD-10-CM

## 2020-03-30 DIAGNOSIS — Z130 Encounter for screening for diseases of the blood and blood-forming organs and certain disorders involving the immune mechanism: Secondary | ICD-10-CM

## 2020-03-30 DIAGNOSIS — Z131 Encounter for screening for diabetes mellitus: Secondary | ICD-10-CM

## 2020-03-30 DIAGNOSIS — Z3A24 24 weeks gestation of pregnancy: Secondary | ICD-10-CM

## 2020-03-30 LAB — POCT URINALYSIS DIPSTICK OB
Bilirubin, UA: NEGATIVE
Blood, UA: NEGATIVE
Glucose, UA: NEGATIVE
Ketones, UA: NEGATIVE
Nitrite, UA: NEGATIVE
POC,PROTEIN,UA: NEGATIVE
Spec Grav, UA: <=1.005 — AB (ref 1.010–1.025)
Urobilinogen, UA: 0.2 E.U./dL
pH, UA: 6 (ref 5.0–8.0)

## 2020-03-30 NOTE — Progress Notes (Signed)
ROB-Doing well. Questions answered regarding pain management in labor (see AVS), history significant for long prodromal labor with first. Glucola alternative list given, agrees to screening and CBC next visit. Anticipatory guidance regarding course of prenatal care. Reviewed red flag symptoms and when to call. RTC x 4 weeks for 28 week labs and ROB with ANNIE or sooner if needed.

## 2020-03-30 NOTE — Patient Instructions (Addendum)
Common Medications Safe in Pregnancy  Acne:      Constipation:  Benzoyl Peroxide     Colace  Clindamycin      Dulcolax Suppository  Topica Erythromycin     Fibercon  Salicylic Acid      Metamucil         Miralax AVOID:        Senakot   Accutane    Cough:  Retin-A       Cough Drops  Tetracycline      Phenergan w/ Codeine if Rx  Minocycline      Robitussin (Plain & DM)  Antibiotics:     Crabs/Lice:  Ceclor       RID  Cephalosporins    AVOID:  E-Mycins      Kwell  Keflex  Macrobid/Macrodantin   Diarrhea:  Penicillin      Kao-Pectate  Zithromax      Imodium AD         PUSH FLUIDS AVOID:       Cipro     Fever:  Tetracycline      Tylenol (Regular or Extra  Minocycline       Strength)  Levaquin      Extra Strength-Do not          Exceed 8 tabs/24 hrs Caffeine:        <200mg/day (equiv. To 1 cup of coffee or  approx. 3 12 oz sodas)         Gas: Cold/Hayfever:       Gas-X  Benadryl      Mylicon  Claritin       Phazyme  **Claritin-D        Chlor-Trimeton    Headaches:  Dimetapp      ASA-Free Excedrin  Drixoral-Non-Drowsy     Cold Compress  Mucinex (Guaifenasin)     Tylenol (Regular or Extra  Sudafed/Sudafed-12 Hour     Strength)  **Sudafed PE Pseudoephedrine   Tylenol Cold & Sinus     Vicks Vapor Rub  Zyrtec  **AVOID if Problems With Blood Pressure         Heartburn: Avoid lying down for at least 1 hour after meals  Aciphex      Maalox     Rash:  Milk of Magnesia     Benadryl    Mylanta       1% Hydrocortisone Cream  Pepcid  Pepcid Complete   Sleep Aids:  Prevacid      Ambien   Prilosec       Benadryl  Rolaids       Chamomile Tea  Tums (Limit 4/day)     Unisom         Tylenol PM         Warm milk-add vanilla or  Hemorrhoids:       Sugar for taste  Anusol/Anusol H.C.  (RX: Analapram 2.5%)  Sugar Substitutes:  Hydrocortisone OTC     Ok in moderation  Preparation H      Tucks        Vaseline lotion applied to tissue with  wiping    Herpes:     Throat:  Acyclovir      Oragel  Famvir  Valtrex     Vaccines:         Flu Shot Leg Cramps:       *Gardasil  Benadryl      Hepatitis A         Hepatitis B Nasal Spray:         Pneumovax  Saline Nasal Spray     Polio Booster         Tetanus Nausea:       Tuberculosis test or PPD  Vitamin B6 25 mg TID   AVOID:    Dramamine      *Gardasil  Emetrol       Live Poliovirus  Ginger Root 250 mg QID    MMR (measles, mumps &  High Complex Carbs @ Bedtime    rebella)  Sea Bands-Accupressure    Varicella (Chickenpox)  Unisom 1/2 tab TID     *No known complications           If received before Pain:         Known pregnancy;   Darvocet       Resume series after  Lortab        Delivery  Percocet    Yeast:   Tramadol      Femstat  Tylenol 3      Gyne-lotrimin  Ultram       Monistat  Vicodin           MISC:         All Sunscreens           Hair Coloring/highlights          Insect Repellant's          (Including DEET)         Mystic Tans   Second Trimester of Pregnancy  The second trimester of pregnancy is from week 13 through week 27. This is also called months 4 through 6 of pregnancy. This is often the time when you feel your best. During the second trimester:  Morning sickness is less or has stopped.  You may have more energy.  You may feel hungry more often. At this time, your unborn baby (fetus) is growing very fast. At the end of the sixth month, the unborn baby may be up to 12 inches long and weigh about 1 pounds. You will likely start to feel the baby move between 16 and 20 weeks of pregnancy. Body changes during your second trimester Your body continues to go through many changes during this time. The changes vary and generally return to normal after the baby is born. Physical changes  You will gain more weight.  You may start to get stretch marks on your hips, belly (abdomen), and breasts.  Your breasts will grow and may hurt.  Dark spots or  blotches may develop on your face.  A dark line from your belly button to the pubic area (linea nigra) may appear.  You may have changes in your hair. Health changes  You may have headaches.  You may have heartburn.  You may have trouble pooping (constipation).  You may have hemorrhoids or swollen, bulging veins (varicose veins).  Your gums may bleed.  You may pee (urinate) more often.  You may have back pain. Follow these instructions at home: Medicines  Take over-the-counter and prescription medicines only as told by your doctor. Some medicines are not safe during pregnancy.  Take a prenatal vitamin that contains at least 600 micrograms (mcg) of folic acid. Eating and drinking  Eat healthy meals that include: ? Fresh fruits and vegetables. ? Whole grains. ? Good sources of protein, such as meat, eggs, or tofu. ? Low-fat dairy products.  Avoid raw meat and unpasteurized juice, milk, and cheese.  You may need to take these actions to prevent or treat  trouble pooping: ? Drink enough fluids to keep your pee (urine) pale yellow. ? Eat foods that are high in fiber. These include beans, whole grains, and fresh fruits and vegetables. ? Limit foods that are high in fat and sugar. These include fried or sweet foods. Activity  Exercise only as told by your doctor. Most people can do their usual exercise during pregnancy. Try to exercise for 30 minutes at least 5 days a week.  Stop exercising if you have pain or cramps in your belly or lower back.  Do not exercise if it is too hot or too humid, or if you are in a place of great height (high altitude).  Avoid heavy lifting.  If you choose to, you may have sex unless your doctor tells you not to. Relieving pain and discomfort  Wear a good support bra if your breasts are sore.  Take warm water baths (sitz baths) to soothe pain or discomfort caused by hemorrhoids. Use hemorrhoid cream if your doctor approves.  Rest with  your legs raised (elevated) if you have leg cramps or low back pain.  If you develop bulging veins in your legs: ? Wear support hose as told by your doctor. ? Raise your feet for 15 minutes, 3-4 times a day. ? Limit salt in your food. Safety  Wear your seat belt at all times when you are in a car.  Talk with your doctor if someone is hurting you or yelling at you a lot. Lifestyle  Do not use hot tubs, steam rooms, or saunas.  Do not douche. Do not use tampons or scented sanitary pads.  Avoid cat litter boxes and soil used by cats. These carry germs that can harm your baby and can cause a loss of your baby by miscarriage or stillbirth.  Do not use herbal medicines, illegal drugs, or medicines that are not approved by your doctor. Do not drink alcohol.  Do not smoke or use any products that contain nicotine or tobacco. If you need help quitting, ask your doctor. General instructions  Keep all follow-up visits. This is important.  Ask your doctor about local prenatal classes.  Ask your doctor about the right foods to eat or for help finding a counselor. Where to find more information  American Pregnancy Association: americanpregnancy.org  [employer]: www.acog.org  Office on [employer]: KeywordPortfolios.com.br Contact a doctor if:  You have a headache that does not go away when you take medicine.  You have changes in how you see, or you see spots in front of your eyes.  You have mild cramps, pressure, or pain in your lower belly.  You continue to feel like you may vomit (nauseous), you vomit, or you have watery poop (diarrhea).  You have bad-smelling fluid coming from your vagina.  You have pain when you pee or your pee smells bad.  You have very bad swelling of your face, hands, ankles, feet, or legs.  You have a fever. Get help right away if:  You are leaking fluid from your vagina.  You have spotting or bleeding  from your vagina.  You have very bad belly cramping or pain.  You have trouble breathing.  You have chest pain.  You faint.  You have not felt your baby move for the time period told by your doctor.  You have new or increased pain, swelling, or redness in an arm or leg. Summary  The second trimester of pregnancy is from week  13 through week 27 (months 4 through 6).  Eat healthy meals.  Exercise as told by your doctor. Most people can do their usual exercise during pregnancy.  Do not use herbal medicines, illegal drugs, or medicines that are not approved by your doctor. Do not drink alcohol.  Call your doctor if you get sick or if you notice anything unusual about your pregnancy. This information is not intended to replace advice given to you by your health care provider. Make sure you discuss any questions you have with your health care provider. Document Revised: 06/01/2019 Document Reviewed: 04/07/2019 Elsevier Patient Education  2021 [employer].   Pain Relief During Labor and Delivery Many things can cause pain during labor and delivery, including:  Pressure due to the baby moving through the pelvis.  Stretching of tissues due to the baby moving through the birth canal.  Muscle tension due to anxiety or nervousness.  The uterus tightening (contracting)and relaxing to help move the baby. How do I get pain relief during labor and delivery? Discuss your pain relief options with your health care provider during your prenatal visits. Explore the options offered by your hospital or birth center. There are many ways to deal with the pain of labor and delivery. You can try relaxation techniques or doing relaxing activities, taking a warm shower or bath (hydrotherapy), or other methods. There are also many medicines available to help control pain. Relaxation techniques and activities Practice relaxation techniques or do relaxing activities, such as:  Focused  breathing.  Meditation.  Visualization.  Aroma therapy.  Listening to your favorite music.  Hypnosis. Hydrotherapy Take a warm shower or bath. This may:  Provide comfort and relaxation.  Lessen your feeling of pain.  Reduce the amount of pain medicine needed.  Shorten the length of labor. Other methods Try doing other things, such as:  Getting a massage or having counterpressure on your back.  Applying warm packs or ice packs.  Changing positions often, moving around, or using a birthing ball. Medicines You may be given:  Pain medicine through an IV or an injection into a muscle.  Pain medicine inserted into your spinal column.  Injections of sterile water just under the skin on your lower back.  Nitrous oxide inhalation therapy, also called laughing gas.   What kinds of medicine are available for pain relief? There are two kinds of medicines that can be used to relieve pain during labor and delivery:  Analgesics. These medicines decrease pain without causing you to lose feeling or the ability to move your muscles.  Anesthetics. These medicines block feeling in the body and can decrease your ability to move freely. Both kinds of medicine can cause minor side effects, such as nausea, trouble concentrating, and sleepiness. They can also affect the baby's heart rate before birth and his or her breathing after birth. For this reason, health care providers are careful about when and how much medicine is given. Which medicines are used to provide pain relief? Common medicines The most common medicines used to help manage pain during labor and delivery include:  Opioids. Opioids are medicines that decrease how much pain you feel (perception of pain). These medicines can be given through an IV or may be used with anesthetics to block pain.  Epidural analgesia. ? Epidural analgesia is given through a very thin tube that is inserted into the lower back. Medicine is  delivered continuously to the area near your spinal column nerves (epidural space). After having this  treatment, you may be able to move your legs, but you will not be able to walk. Depending on the amount and type of medicine given, you may lose all feeling in the lower half of your body, or you may have some sensation, including the urge to push. This treatment can be used to give pain relief for a vaginal birth. ? Sometimes, a numbing medicine is injected into the spinal fluid when an epidural catheter is placed. This provides for immediate relief but only lasts for 1-2 hours. Once it wears off, the epidural will provide pain relief. This is called a combined spinal-epidural (CSE) block.  Intrathecal analgesia (spinal analgesia). Intrathecal analgesia is similar to epidural analgesia, but the medicine is injected into the spinal fluid instead of the epidural space. It is usually only given once. It starts to relieve pain quickly, but the pain relief lasts only 1-2 hours.  Pudendal block. This block is done by injecting numbing medicine through the wall of the vagina and into a nerve in the pelvis. Other medicines Other medicines used to help manage pain during labor and delivery include:  Local anesthetics. These are used to numb a small area of the body. They may be used along with another kind of medicine or used to numb the nerves of the vagina, cervix, and perineum during the second stage of labor.  Spinal block (spinal anesthesia). Spinal anesthesia is similar to spinal analgesia, but the medicine that is used contains longer-acting numbing medicines and pain medicines. This type of anesthesia can be used for a cesarean delivery and allows you to stay awake for the birth of your baby.  General anesthetics cause you to lose consciousness so you do not feel pain. They are usually only used for an emergency cesarean delivery. These medicines are given through an IV or a mask or both. These  medicines are used as part of a procedure or for an emergency delivery. Summary  Women have many options to help them manage the pain associated with labor and delivery.  You can try doing relaxing activities, taking a warm shower or bath, or other methods.  There are also many medicines available to help control pain during labor and delivery.  Talk with your health care provider about what options are available to you. This information is not intended to replace advice given to you by your health care provider. Make sure you discuss any questions you have with your health care provider. Document Revised: 11/10/2018 Document Reviewed: 11/10/2018 Elsevier Patient Education  [employer].

## 2020-04-02 ENCOUNTER — Telehealth: Payer: Self-pay | Admitting: Certified Nurse Midwife

## 2020-04-02 NOTE — Telephone Encounter (Signed)
Patient called 3/25 at 4:45pm

## 2020-04-02 NOTE — Telephone Encounter (Signed)
Patient called 3/25 at 4:45pm with questions regarding her bill- states she received a bill for over $600 and is wanting some clarification. Please Advise.

## 2020-04-03 NOTE — Telephone Encounter (Signed)
Pt aware we do not add deductible when we confirm [employer] matern[employer] benefits. Encouraged her to contact her insurance company. Balance with [employer] is 182.

## 2020-04-25 ENCOUNTER — Ambulatory Visit (INDEPENDENT_AMBULATORY_CARE_PROVIDER_SITE_OTHER): Payer: 59 | Admitting: Certified Nurse Midwife

## 2020-04-25 ENCOUNTER — Encounter: Payer: Self-pay | Admitting: Certified Nurse Midwife

## 2020-04-25 ENCOUNTER — Other Ambulatory Visit: Payer: Self-pay

## 2020-04-25 ENCOUNTER — Other Ambulatory Visit: Payer: 59

## 2020-04-25 VITALS — BP 138/87 | HR 82 | Wt 154.1 lb

## 2020-04-25 DIAGNOSIS — Z3402 Encounter for supervision of normal first pregnancy, second trimester: Secondary | ICD-10-CM

## 2020-04-25 DIAGNOSIS — Z130 Encounter for screening for diseases of the blood and blood-forming organs and certain disorders involving the immune mechanism: Secondary | ICD-10-CM

## 2020-04-25 DIAGNOSIS — Z3A28 28 weeks gestation of pregnancy: Secondary | ICD-10-CM

## 2020-04-25 DIAGNOSIS — Z131 Encounter for screening for diabetes mellitus: Secondary | ICD-10-CM

## 2020-04-25 LAB — POCT URINALYSIS DIPSTICK OB
Bilirubin, UA: NEGATIVE
Blood, UA: NEGATIVE
Glucose, UA: NEGATIVE
Ketones, UA: NEGATIVE
Nitrite, UA: NEGATIVE
POC,PROTEIN,UA: NEGATIVE
Spec Grav, UA: 1.01 (ref 1.010–1.025)
Urobilinogen, UA: 0.2 E.U./dL
pH, UA: 6 (ref 5.0–8.0)

## 2020-04-25 NOTE — Progress Notes (Signed)
Patient comes in today for ROB visit. Patient has no concerns today. BTC form signed today. Patient declined Tdap.

## 2020-04-25 NOTE — Progress Notes (Signed)
Tammy Burgess doing well. Feels good movement. 28 wk labs today: Glucose screen/RPR/CBC. Tdap declined, Blood transfusion consent completed, all questions answered. Ready set baby reviewed, see check list for topics covered. Sample birth plan given, will follow up in upcoming visits. Discussed birth control after delivery, information pamphlet given.   Follow up 2 wk with Marcelino Duster for Tammy Burgess or sooner if needed.    Doreene Burke, CNM

## 2020-04-25 NOTE — Patient Instructions (Signed)
Td (Tetanus, Diphtheria) Vaccine: What You Need to Know 1. Why get vaccinated? Td vaccine can prevent tetanus and diphtheria. Tetanus enters the body through cuts or wounds. Diphtheria spreads from person to person.  TETANUS (T) causes painful stiffening of the muscles. Tetanus can lead to serious health problems, including being unable to open the mouth, having trouble swallowing and breathing, or death.  DIPHTHERIA (D) can lead to difficulty breathing, heart failure, paralysis, or death. 2. Td vaccine Td is only for children 7 years and older, adolescents, and adults.  Td is usually given as a booster dose every 10 years, or after 5 years in the case of a severe or dirty wound or burn. Another vaccine, called "Tdap," may be used instead of Td. Tdap protects against pertussis, also known as "whooping cough," in addition to tetanus and diphtheria. Td may be given at the same time as other vaccines. 3. Talk with your health care provider Tell your vaccination provider if the person getting the vaccine:  Has had an allergic reaction after a previous dose of any vaccine that protects against tetanus or diphtheria, or has any severe, life-threatening allergies  Has ever had Guillain-Barr Syndrome (also called "GBS")  Has had severe pain or swelling after a previous dose of any vaccine that protects against tetanus or diphtheria In some cases, your health care provider may decide to postpone Td vaccination until a future visit. People with minor illnesses, such as a cold, may be vaccinated. People who are moderately or severely ill should usually wait until they recover before getting Td vaccine.  Your health care provider can give you more information. 4. Risks of a vaccine reaction  Pain, redness, or swelling where the shot was given, mild fever, headache, feeling tired, and nausea, vomiting, diarrhea, or stomachache sometimes happen after Td vaccination. People sometimes faint after medical  procedures, including vaccination. Tell your provider if you feel dizzy or have vision changes or ringing in the ears.  As with any medicine, there is a very remote chance of a vaccine causing a severe allergic reaction, other serious injury, or death. 5. What if there is a serious problem? An allergic reaction could occur after the vaccinated person leaves the clinic. If you see signs of a severe allergic reaction (hives, swelling of the face and throat, difficulty breathing, a fast heartbeat, dizziness, or weakness), call 9-1-1 and get the person to the nearest hospital.  For other signs that concern you, call your health care provider.  Adverse reactions should be reported to the Vaccine Adverse Event Reporting System (VAERS). Your health care provider will usually file this report, or you can do it yourself. Visit the VAERS website at www.vaers.hhs.gov or call 1-800-822-7967. VAERS is only for reporting reactions, and VAERS staff members do not give medical advice. 6. The National Vaccine Injury Compensation Program The National Vaccine Injury Compensation Program (VICP) is a federal program that was created to compensate people who may have been injured by certain vaccines. Claims regarding alleged injury or death due to vaccination have a time limit for filing, which may be as short as two years. Visit the VICP website at www.hrsa.gov/vaccinecompensation or call 1-800-338-2382 to learn about the program and about filing a claim. 7. How can I learn more?  Ask your health care provider.  Call your local or state health department.  Visit the website of the Food and Drug Administration (FDA) for vaccine package inserts and additional information at www.fda.gov/vaccines-blood-biologics/vaccines.  Contact the Centers for   Disease Control and Prevention (CDC): ? Call 1-800-232-4636 (1-800-CDC-INFO) or ? Visit CDC's website at www.cdc.gov/vaccines. Vaccine Information Statement Td (Tetanus,  Diphtheria) Vaccine (08/12/2019) This information is not intended to replace advice given to you by your health care provider. Make sure you discuss any questions you have with your health care provider. Document Revised: 09/29/2019 Document Reviewed: 09/29/2019 Elsevier Patient Education  2021 Elsevier Inc.  

## 2020-04-26 LAB — CBC
Hematocrit: 37.9 % (ref 34.0–46.6)
Hemoglobin: 12.7 g/dL (ref 11.1–15.9)
MCH: 29.3 pg (ref 26.6–33.0)
MCHC: 33.5 g/dL (ref 31.5–35.7)
MCV: 87 fL (ref 79–97)
Platelets: 346 10*3/uL (ref 150–450)
RBC: 4.34 x10E6/uL (ref 3.77–5.28)
RDW: 12.4 % (ref 11.7–15.4)
WBC: 12.5 10*3/uL — ABNORMAL HIGH (ref 3.4–10.8)

## 2020-04-26 LAB — GLUCOSE, 1 HOUR GESTATIONAL: Gestational Diabetes Screen: 79 mg/dL (ref 65–139)

## 2020-04-30 ENCOUNTER — Encounter: Payer: 59 | Admitting: Certified Nurse Midwife

## 2020-04-30 ENCOUNTER — Other Ambulatory Visit: Payer: 59

## 2020-05-10 ENCOUNTER — Ambulatory Visit (INDEPENDENT_AMBULATORY_CARE_PROVIDER_SITE_OTHER): Payer: 59 | Admitting: Certified Nurse Midwife

## 2020-05-10 ENCOUNTER — Encounter: Payer: Self-pay | Admitting: Certified Nurse Midwife

## 2020-05-10 ENCOUNTER — Other Ambulatory Visit: Payer: Self-pay

## 2020-05-10 VITALS — BP 129/68 | HR 86 | Wt 161.7 lb

## 2020-05-10 DIAGNOSIS — Z3A30 30 weeks gestation of pregnancy: Secondary | ICD-10-CM

## 2020-05-10 DIAGNOSIS — Z3403 Encounter for supervision of normal first pregnancy, third trimester: Secondary | ICD-10-CM

## 2020-05-10 LAB — POCT URINALYSIS DIPSTICK OB
Bilirubin, UA: NEGATIVE
Blood, UA: NEGATIVE
Glucose, UA: NEGATIVE
Ketones, UA: NEGATIVE
Nitrite, UA: NEGATIVE
POC,PROTEIN,UA: NEGATIVE
Spec Grav, UA: 1.01 (ref 1.010–1.025)
Urobilinogen, UA: 0.2 E.U./dL
pH, UA: 6 (ref 5.0–8.0)

## 2020-05-10 NOTE — Progress Notes (Signed)
ROB-Sent MyChart message lower abdominal discomfort and muscle spasms, symptoms have since resolved. Discussed home treatment measures including use of abdominal support and addition of magnesium supplement. Anticipatory guidance regarding course of prenatal care. Reviewed red flag symptoms and when to call. RTC x 2 weeks for ROB with ANNIE; RTC x 4 weeks for ROB with JML or sooner if needed.   Juliann Pares, Student-[employer] 05/10/20 4:32 PM

## 2020-05-10 NOTE — Patient Instructions (Addendum)
Fetal Movement Counts Patient Name: ________________________________________________ Patient Due Date: ____________________  What is a fetal movement count? A fetal movement count is the number of times that you feel your baby move during a certain amount of time. This may also be called a fetal kick count. A fetal movement count is recommended for every pregnant woman. You may be asked to start counting fetal movements as early as week 28 of your pregnancy. Pay attention to when your baby is most active. You may notice your baby's sleep and wake cycles. You may also notice things that make your baby move more. You should do a fetal movement count:  When your baby is normally most active.  At the same time each day. A good time to count movements is while you are resting, after having something to eat and drink. How do I count fetal movements? 1. Find a quiet, comfortable area. Sit, or lie down on your side. 2. Write down the date, the start time and stop time, and the number of movements that you felt between those two times. Take this information with you to your health care visits. 3. Write down your start time when you feel the first movement. 4. Count kicks, flutters, swishes, rolls, and jabs. You should feel at least 10 movements. 5. You may stop counting after you have felt 10 movements, or if you have been counting for 2 hours. Write down the stop time. 6. If you do not feel 10 movements in 2 hours, contact your health care provider for further instructions. Your health care provider may want to do additional tests to assess your baby's well-being. Contact a health care provider if:  You feel fewer than 10 movements in 2 hours.  Your baby is not moving like he or she usually does. Date: ____________ Start time: ____________ Stop time: ____________ Movements: ____________ Date: ____________ Start time: ____________ Stop time: ____________ Movements: ____________ Date: ____________  Start time: ____________ Stop time: ____________ Movements: ____________ Date: ____________ Start time: ____________ Stop time: ____________ Movements: ____________ Date: ____________ Start time: ____________ Stop time: ____________ Movements: ____________ Date: ____________ Start time: ____________ Stop time: ____________ Movements: ____________ Date: ____________ Start time: ____________ Stop time: ____________ Movements: ____________ Date: ____________ Start time: ____________ Stop time: ____________ Movements: ____________ Date: ____________ Start time: ____________ Stop time: ____________ Movements: ____________ This information is not intended to replace advice given to you by your health care provider. Make sure you discuss any questions you have with your health care provider. Document Revised: 08/12/2018 Document Reviewed: 08/12/2018 Elsevier Patient Education  [place].   [employer]'s Emergency Medicine: Concepts and Clinical Practice (9th ed., pp. 2296- 2312). Elsevier.">  Braxton Hicks Contractions Contractions of the uterus can occur throughout pregnancy, but they are not always a sign that you are in labor. You may have practice contractions called Braxton Hicks contractions. These false labor contractions are sometimes confused with true labor. What are [place] contractions? Braxton Hicks contractions are tightening movements that occur in the muscles of the uterus before labor. Unlike true labor contractions, these contractions do not result in opening (dilation) and thinning of the cervix. Toward the end of pregnancy (32-34 weeks), Braxton Hicks contractions can happen more often and may become stronger. These contractions are sometimes difficult to tell apart from true labor because they can be very uncomfortable. You should not feel embarrassed if you go to the hospital with false labor. Sometimes, the only way to tell if you are in true labor is  for your health care  provider to look for changes in the cervix. The health care provider will do a physical exam and may monitor your contractions. If you are not in true labor, the exam should show that your cervix is not dilating and your water has not broken. If there are no other health problems associated with your pregnancy, it is completely safe for you to be sent home with false labor. You may continue to have Braxton Hicks contractions until you go into true labor. How to tell the difference between true labor and false labor True labor  Contractions last 30-70 seconds.  Contractions become very regular.  Discomfort is usually felt in the top of the uterus, and it spreads to the lower abdomen and low back.  Contractions do not go away with walking.  Contractions usually become more intense and increase in frequency.  The cervix dilates and gets thinner. False labor  Contractions are usually shorter and not as strong as true labor contractions.  Contractions are usually irregular.  Contractions are often felt in the front of the lower abdomen and in the groin.  Contractions may go away when you walk around or change positions while lying down.  Contractions get weaker and are shorter-lasting as time goes on.  The cervix usually does not dilate or become thin. Follow these instructions at home:  Take over-the-counter and prescription medicines only as told by your health care provider.  Keep up with your usual exercises and follow other instructions from your health care provider.  Eat and drink lightly if you think you are going into labor.  If Braxton Hicks contractions are making you uncomfortable: ? Change your position from lying down or resting to walking, or change from walking to resting. ? Sit and rest in a tub of warm water. ? Drink enough fluid to keep your urine pale yellow. Dehydration may cause these contractions. ? Do slow and deep breathing several times an hour.  Keep  all follow-up prenatal visits as told by your health care provider. This is important.   Contact a health care provider if:  You have a fever.  You have continuous pain in your abdomen. Get help right away if:  Your contractions become stronger, more regular, and closer together.  You have fluid leaking or gushing from your vagina.  You pass blood-tinged mucus (bloody show).  You have bleeding from your vagina.  You have low back pain that you never had before.  You feel your baby's head pushing down and causing pelvic pressure.  Your baby is not moving inside you as much as it used to. Summary  Contractions that occur before labor are called Braxton Hicks contractions, false labor, or practice contractions.  Braxton Hicks contractions are usually shorter, weaker, farther apart, and less regular than true labor contractions. True labor contractions usually become progressively stronger and regular, and they become more frequent.  Manage discomfort from Braxton Hicks contractions by changing position, resting in a warm bath, drinking plenty of water, or practicing deep breathing. This information is not intended to replace advice given to you by your health care provider. Make sure you discuss any questions you have with your health care provider. Document Revised: 12/05/2016 Document Reviewed: 05/08/2016 Elsevier Patient Education  2021 Elsevier Inc.   Third Trimester of Pregnancy  The third trimester of pregnancy is from week 28 through week 40. This is also called months 7 through 9. This trimester is when your unborn baby (fetus)   is growing very fast. At the end of the ninth month, the unborn baby is about 20 inches long. It weighs about 6-10 pounds. Body changes during your third trimester Your body continues to go through many changes during this time. The changes vary and generally return to normal after the baby is born. Physical changes  Your weight will continue to  increase. You may gain 25-35 pounds (11-16 kg) by the end of the pregnancy. If you are underweight, you may gain 28-40 lb (about 13-18 kg). If you are overweight, you may gain 15-25 lb (about 7-11 kg).  You may start to get stretch marks on your hips, belly (abdomen), and breasts.  Your breasts will continue to grow and may hurt. A yellow fluid (colostrum) may leak from your breasts. This is the first milk you are making for your baby.  You may have changes in your hair.  Your belly button may stick out.  You may have more swelling in your hands, face, or ankles. Health changes  You may have heartburn.  You may have trouble pooping (constipation).  You may get hemorrhoids. These are swollen veins in the butt that can itch or get painful.  You may have swollen veins (varicose veins) in your legs.  You may have more body aches in the pelvis, back, or thighs.  You may have more tingling or numbness in your hands, arms, and legs. The skin on your belly may also feel numb.  You may feel short of breath as your womb (uterus) gets bigger. Other changes  You may pee (urinate) more often.  You may have more problems sleeping.  You may notice the unborn baby "dropping," or moving lower in your belly.  You may have more discharge coming from your vagina.  Your joints may feel loose, and you may have pain around your pelvic bone. Follow these instructions at home: Medicines  Take over-the-counter and prescription medicines only as told by your doctor. Some medicines are not safe during pregnancy.  Take a prenatal vitamin that contains at least 600 micrograms (mcg) of folic acid. Eating and drinking  Eat healthy meals that include: ? Fresh fruits and vegetables. ? Whole grains. ? Good sources of protein, such as meat, eggs, or tofu. ? Low-fat dairy products.  Avoid raw meat and unpasteurized juice, milk, and cheese. These carry germs that can harm you and your baby.  Eat 4 or  5 small meals rather than 3 large meals a day.  You may need to take these actions to prevent or treat trouble pooping: ? Drink enough fluids to keep your pee (urine) pale yellow. ? Eat foods that are high in fiber. These include beans, whole grains, and fresh fruits and vegetables. ? Limit foods that are high in fat and sugar. These include fried or sweet foods. Activity  Exercise only as told by your doctor. Stop exercising if you start to have cramps in your womb.  Avoid heavy lifting.  Do not exercise if it is too hot or too humid, or if you are in a place of great height (high altitude).  If you choose to, you may have sex unless your doctor tells you not to. Relieving pain and discomfort  Take breaks often, and rest with your legs raised (elevated) if you have leg cramps or low back pain.  Take warm water baths (sitz baths) to soothe pain or discomfort caused by hemorrhoids. Use hemorrhoid cream if your doctor approves.  Wear a good   support bra if your breasts are tender.  If you develop bulging, swollen veins in your legs: ? Wear support hose as told by your doctor. ? Raise your feet for 15 minutes, 3-4 times a day. ? Limit salt in your food. Safety  Talk to your doctor before traveling far distances.  Do not use hot tubs, steam rooms, or saunas.  Wear your seat belt at all times when you are in a car.  Talk with your doctor if someone is hurting you or yelling at you a lot. Preparing for your baby's arrival To prepare for the arrival of your baby:  Take prenatal classes.  Visit the hospital and tour the maternity area.  Buy a rear-facing car seat. Learn how to install it in your car.  Prepare the baby's room. Take out all pillows and stuffed animals from the baby's crib. General instructions  Avoid cat litter boxes and soil used by cats. These carry germs that can cause harm to the baby and can cause a loss of your baby by miscarriage or stillbirth.  Do not  douche or use tampons. Do not use scented sanitary pads.  Do not smoke or use any products that contain nicotine or tobacco. If you need help quitting, ask your doctor.  Do not drink alcohol.  Do not use herbal medicines, illegal drugs, or medicines that were not approved by your doctor. Chemicals in these products can affect your baby.  Keep all follow-up visits. This is important. Where to find more information  American Pregnancy Association: americanpregnancy.org  [employer]: www.acog.org  Office on Women's Health: KeywordPortfolios.com.br Contact a doctor if:  You have a fever.  You have mild cramps or pressure in your lower belly.  You have a nagging pain in your belly area.  You vomit, or you have watery poop (diarrhea).  You have bad-smelling fluid coming from your vagina.  You have pain when you pee, or your pee smells bad.  You have a headache that does not go away when you take medicine.  You have changes in how you see, or you see spots in front of your eyes. Get help right away if:  Your water breaks.  You have regular contractions that are less than 5 minutes apart.  You are spotting or bleeding from your vagina.  You have very bad belly cramps or pain.  You have trouble breathing.  You have chest pain.  You faint.  You have not felt the baby move for the amount of time told by your doctor.  You have new or increased pain, swelling, or redness in an arm or leg. Summary  The third trimester is from week 28 through week 40 (months 7 through 9). This is the time when your unborn baby is growing very fast.  During this time, your discomfort may increase as you gain weight and as your baby grows.  Get ready for your baby to arrive by taking prenatal classes, buying a rear-facing car seat, and preparing the baby's room.  Get help right away if you are bleeding from your vagina, you have chest pain and  trouble breathing, or you have not felt the baby move for the amount of time told by your doctor. This information is not intended to replace advice given to you by your health care provider. Make sure you discuss any questions you have with your health care provider. Document Revised: 06/01/2019 Document Reviewed: 04/07/2019 Elsevier Patient Education  2021 Elsevier  Inc.  

## 2020-05-10 NOTE — Progress Notes (Signed)
ROB: she had some tightness and pain earlier today, but it has resolved. No new concerns.

## 2020-05-11 NOTE — Progress Notes (Signed)
I have seen, interviewed, and examined the patient in conjunction with the [facility] [employer] [facility] and affirm the diagnosis and management plan.   Gunnar Bulla, CNM [facility], [facility] 05/11/20 9:47 AM

## 2020-05-23 ENCOUNTER — Encounter: Payer: Self-pay | Admitting: Certified Nurse Midwife

## 2020-05-23 ENCOUNTER — Encounter: Payer: 59 | Admitting: Certified Nurse Midwife

## 2020-05-23 DIAGNOSIS — Z3403 Encounter for supervision of normal first pregnancy, third trimester: Secondary | ICD-10-CM

## 2020-05-23 DIAGNOSIS — Z3A32 32 weeks gestation of pregnancy: Secondary | ICD-10-CM

## 2020-06-07 ENCOUNTER — Ambulatory Visit (INDEPENDENT_AMBULATORY_CARE_PROVIDER_SITE_OTHER): Payer: 59 | Admitting: Certified Nurse Midwife

## 2020-06-07 ENCOUNTER — Other Ambulatory Visit: Payer: Self-pay

## 2020-06-07 ENCOUNTER — Encounter: Payer: Self-pay | Admitting: Certified Nurse Midwife

## 2020-06-07 VITALS — BP 119/76 | HR 89 | Wt 166.2 lb

## 2020-06-07 DIAGNOSIS — Z3483 Encounter for supervision of other normal pregnancy, third trimester: Secondary | ICD-10-CM

## 2020-06-07 DIAGNOSIS — Z3A34 34 weeks gestation of pregnancy: Secondary | ICD-10-CM

## 2020-06-07 DIAGNOSIS — Z3403 Encounter for supervision of normal first pregnancy, third trimester: Secondary | ICD-10-CM

## 2020-06-07 DIAGNOSIS — Z113 Encounter for screening for infections with a predominantly sexual mode of transmission: Secondary | ICD-10-CM

## 2020-06-07 LAB — POCT URINALYSIS DIPSTICK OB
Bilirubin, UA: NEGATIVE
Blood, UA: NEGATIVE
Glucose, UA: NEGATIVE
Ketones, UA: NEGATIVE
Leukocytes, UA: NEGATIVE
Nitrite, UA: NEGATIVE
POC,PROTEIN,UA: NEGATIVE
Spec Grav, UA: 1.01
Urobilinogen, UA: 0.2 U/dL
pH, UA: 6

## 2020-06-07 NOTE — Progress Notes (Signed)
ROB: She is doing well, no concerns. 

## 2020-06-07 NOTE — Progress Notes (Signed)
ROB-Doing well. Missed last appointment due to cold, feeling better now. Questions answered regarding GBS testing, fetal gut colonization and home methods for vaginal decolonization. Anticipatory guidance regarding course of prenatal care. Reviewed red flag symptoms and when to call. RTC x 2 weeks for 36 week cultures, STI panel, and ROB with ANNIE or sooner if needed.

## 2020-06-07 NOTE — Patient Instructions (Addendum)
Group B Streptococcus Test During Pregnancy Why am I having this test? Routine testing, also called screening, for group B streptococcus (GBS) is recommended for all pregnant women between the 36th and 37th week of pregnancy. GBS is a type of bacteria that can be passed from mother to baby during childbirth. Screening will help guide whether or not you will need treatment during labor and delivery to prevent complications such as:  An infection in your uterus during labor.  An infection in your uterus after delivery.  A serious infection in your baby after delivery, such as pneumonia, meningitis, or sepsis. GBS screening is not often done before 36 weeks of pregnancy unless you go into labor prematurely. What happens if I have group B streptococcus? If testing shows that you have GBS, your health care provider will recommend treatment with IV antibiotics during labor and delivery. This treatment significantly decreases the risk of complications for you and your baby. If you have a planned C-section and you have GBS, you may not need to be treated with antibiotics because GBS is usually passed to babies after labor starts and your water breaks. If you are in labor or your water breaks before your C-section, it is possible for GBS to get into your uterus and be passed to your baby, so you might need treatment. Is there a chance I may not need to be tested? You may not need to be tested for GBS if:  You have a urine test that shows GBS before 36 to 37 weeks.  You had a baby with GBS infection after a previous delivery. In these cases, you will automatically be treated for GBS during labor and delivery. What is being tested? This test is done to check if you have group B streptococcus in your vagina or rectum. What kind of sample is taken? To collect samples for this test, your health care provider will swab your vagina and rectum with a cotton swab. The sample is then sent to the lab to see if  GBS is present. What happens during the test?  You will remove your clothing from the waist down.  You will lie down on an exam table in the same position as you would for a pelvic exam.  Your health care provider will swab your vagina and rectum to collect samples for a culture test.  You will be able to go home after the test and do all your usual activities.   How are the results reported? The test results are reported as positive or negative. What do the results mean?  A positive test means you are at risk for passing GBS to your baby during labor and delivery. Your health care provider will recommend that you are treated with an IV antibiotic during labor and delivery.  A negative test means you are at very low risk of passing GBS to your baby. There is still a low risk of passing GBS to your baby because sometimes test results may report that you do not have a condition when you do (false-negative result) or there is a chance that you may become infected with GBS after the test is done. You most likely will not need to be treated with an antibiotic during labor and delivery. Talk with your health care provider about what your results mean. Questions to ask your health care provider Ask your health care provider, or the department that is doing the test:  When will my results be ready?  How will   I get my results?  What are my treatment options? Summary  Routine testing (screening) for group B streptococcus (GBS) is recommended for all pregnant women between the 36th and 37th week of pregnancy.  GBS is a type of bacteria that can be passed from mother to baby during childbirth.  If testing shows that you have GBS, your health care provider will recommend that you are treated with IV antibiotics during labor and delivery. This treatment almost always prevents infection in newborns. This information is not intended to replace advice given to you by your health care provider. Make  sure you discuss any questions you have with your health care provider. Document Revised: 10/25/2019 Document Reviewed: 01/20/2018 Elsevier Patient Education  2021 Elsevier Inc.   Fetal Movement Counts Patient Name: ________________________________________________ Patient Due Date: ____________________  What is a fetal movement count? A fetal movement count is the number of times that you feel your baby move during a certain amount of time. This may also be called a fetal kick count. A fetal movement count is recommended for every pregnant woman. You may be asked to start counting fetal movements as early as week 28 of your pregnancy. Pay attention to when your baby is most active. You may notice your baby's sleep and wake cycles. You may also notice things that make your baby move more. You should do a fetal movement count:  When your baby is normally most active.  At the same time each day. A good time to count movements is while you are resting, after having something to eat and drink. How do I count fetal movements? 1. Find a quiet, comfortable area. Sit, or lie down on your side. 2. Write down the date, the start time and stop time, and the number of movements that you felt between those two times. Take this information with you to your health care visits. 3. Write down your start time when you feel the first movement. 4. Count kicks, flutters, swishes, rolls, and jabs. You should feel at least 10 movements. 5. You may stop counting after you have felt 10 movements, or if you have been counting for 2 hours. Write down the stop time. 6. If you do not feel 10 movements in 2 hours, contact your health care provider for further instructions. Your health care provider may want to do additional tests to assess your baby's well-being. Contact a health care provider if:  You feel fewer than 10 movements in 2 hours.  Your baby is not moving like he or she usually does. Date: ____________  Start time: ____________ Stop time: ____________ Movements: ____________ Date: ____________ Start time: ____________ Stop time: ____________ Movements: ____________ Date: ____________ Start time: ____________ Stop time: ____________ Movements: ____________ Date: ____________ Start time: ____________ Stop time: ____________ Movements: ____________ Date: ____________ Start time: ____________ Stop time: ____________ Movements: ____________ Date: ____________ Start time: ____________ Stop time: ____________ Movements: ____________ Date: ____________ Start time: ____________ Stop time: ____________ Movements: ____________ Date: ____________ Start time: ____________ Stop time: ____________ Movements: ____________ Date: ____________ Start time: ____________ Stop time: ____________ Movements: ____________ This information is not intended to replace advice given to you by your health care provider. Make sure you discuss any questions you have with your health care provider. Document Revised: 08/12/2018 Document Reviewed: 08/12/2018 Elsevier Patient Education  2021 [employer].

## 2020-06-20 ENCOUNTER — Other Ambulatory Visit: Payer: Self-pay

## 2020-06-20 ENCOUNTER — Ambulatory Visit (INDEPENDENT_AMBULATORY_CARE_PROVIDER_SITE_OTHER): Payer: 59 | Admitting: Certified Nurse Midwife

## 2020-06-20 ENCOUNTER — Other Ambulatory Visit: Payer: 59

## 2020-06-20 ENCOUNTER — Encounter: Payer: Self-pay | Admitting: Certified Nurse Midwife

## 2020-06-20 VITALS — BP 135/76 | HR 92 | Wt 169.9 lb

## 2020-06-20 DIAGNOSIS — Z3483 Encounter for supervision of other normal pregnancy, third trimester: Secondary | ICD-10-CM

## 2020-06-20 DIAGNOSIS — Z3A34 34 weeks gestation of pregnancy: Secondary | ICD-10-CM

## 2020-06-20 DIAGNOSIS — Z3A36 36 weeks gestation of pregnancy: Secondary | ICD-10-CM

## 2020-06-20 DIAGNOSIS — Z113 Encounter for screening for infections with a predominantly sexual mode of transmission: Secondary | ICD-10-CM

## 2020-06-20 LAB — POCT URINALYSIS DIPSTICK
Bilirubin, UA: NEGATIVE
Blood, UA: NEGATIVE
Glucose, UA: NEGATIVE
Ketones, UA: NEGATIVE
Leukocytes, UA: NEGATIVE
Nitrite, UA: NEGATIVE
Protein, UA: NEGATIVE
Spec Grav, UA: <=1.005 — AB (ref 1.010–1.025)
Urobilinogen, UA: 0.2 E.U./dL
pH, UA: 6 (ref 5.0–8.0)

## 2020-06-20 NOTE — Progress Notes (Signed)
ROB doing well. Feels good movement. Labor precautions reviewd. Swabs collected. All questions answered. Follow up 1 wk with Marcelino Duster for ROB.  Doreene Burke, CNM

## 2020-06-20 NOTE — Patient Instructions (Signed)
[employer] Emergency Medicine: Concepts and Clinical Practice (9th ed., pp. 2296- 2312). Elsevier.">  Braxton Hicks Contractions  Contractions of the uterus can occur throughout pregnancy, but they are not always a sign that you are in labor. You may have practice contractions called Braxton Hicks contractions. These false labor contractions are sometimesconfused with true labor. What are Braxton Hicks contractions? Braxton Hicks contractions are tightening movements that occur in the muscles of the uterus before labor. Unlike true labor contractions, these contractions do not result in opening (dilation) and thinning of the lowest part of the uterus (cervix). Toward the end of pregnancy (32-34 weeks), Braxton Hicks contractions can happen more often and may become stronger. These contractions are sometimesdifficult to tell apart from true labor because they can be very uncomfortable. How to tell the difference between true labor and false labor True labor Contractions last 30-70 seconds. Contractions become very regular. Discomfort is usually felt in the top of the uterus, and it spreads to the lower abdomen and low back. Contractions do not go away with walking. Contractions usually become stronger and more frequent. The cervix dilates and gets thinner. False labor Contractions are usually shorter, weaker, and farther apart than true labor contractions. Contractions are usually irregular. Contractions are often felt in the front of the lower abdomen and in the groin. Contractions may go away when you walk around or change positions while lying down. The cervix usually does not dilate or become thin. Sometimes, the only way to tell if you are in true labor is for your healthcare provider to look for changes in your cervix. Your health care provider will do a physical exam and may monitor your contractions. If you are in true labor, your health care provider will send youhome with instructions  about when to return to the hospital. You may continue to have Braxton Hicks contractions until you go into truelabor. Follow these instructions at home:  Take over-the-counter and prescription medicines only as told by your health care provider. If Braxton Hicks contractions are making you uncomfortable: Change your position from lying down or resting to walking, or change from walking to resting. Sit and rest in a tub of warm water. Drink enough fluid to keep your urine pale yellow. Dehydration may cause these contractions. Do slow and deep breathing several times an hour. Keep all follow-up visits. This is important. Contact a health care provider if: You have a fever. You have continuous pain in your abdomen. Your contractions become stronger, more regular, and closer together. You pass blood-tinged mucus. Get help right away if: You have fluid leaking or gushing from your vagina. You have bright red blood coming from your vagina. Your baby is not moving inside you as much as it used to. Summary You may have practice contractions called Braxton Hicks contractions. These false labor contractions are sometimes confused with true labor. Braxton Hicks contractions are usually shorter, weaker, farther apart, and less regular than true labor contractions. True labor contractions usually become stronger, more regular, and more frequent. Manage discomfort from Braxton Hicks contractions by changing position, resting in a warm bath, practicing deep breathing, and drinking plenty of water. Keep all follow-up visits. Contact your health care provider if your contractions become stronger, more regular, and closer together. This information is not intended to replace advice given to you by your health care provider. Make sure you discuss any questions you have with your healthcare provider. Document Revised: 10/31/2019 Document Reviewed: 10/31/2019 Elsevier Patient Education  2022 Else[employer]    Inc.  

## 2020-06-21 LAB — VIRAL HEPATITIS HBV, HCV
HCV Ab: <0.1 s/co ratio (ref 0.0–0.9)
Hep B Core Total Ab: NEGATIVE
Hep B Surface Ab, Qual: NONREACTIVE
Hepatitis B Surface Ag: NEGATIVE

## 2020-06-21 LAB — RPR: RPR Ser Ql: NONREACTIVE

## 2020-06-21 LAB — HCV INTERPRETATION

## 2020-06-21 LAB — HIV ANTIBODY (ROUTINE TESTING W REFLEX): HIV Screen 4th Generation wRfx: NONREACTIVE

## 2020-06-21 NOTE — Addendum Note (Signed)
Addended by: Silvano Bilis on: 06/21/2020 07:58 AM   Modules accepted: Orders

## 2020-06-23 LAB — STREP GP B NAA: Strep Gp B NAA: POSITIVE — AB

## 2020-06-24 LAB — GC/CHLAMYDIA PROBE AMP
Chlamydia trachomatis, NAA: NEGATIVE
Neisseria Gonorrhoeae by PCR: NEGATIVE

## 2020-06-28 ENCOUNTER — Other Ambulatory Visit: Payer: Self-pay

## 2020-06-28 ENCOUNTER — Encounter: Payer: Self-pay | Admitting: Certified Nurse Midwife

## 2020-06-28 ENCOUNTER — Encounter: Payer: 59 | Admitting: Certified Nurse Midwife

## 2020-06-28 ENCOUNTER — Ambulatory Visit (INDEPENDENT_AMBULATORY_CARE_PROVIDER_SITE_OTHER): Payer: 59 | Admitting: Certified Nurse Midwife

## 2020-06-28 VITALS — BP 121/82 | HR 94 | Wt 172.6 lb

## 2020-06-28 DIAGNOSIS — B951 Streptococcus, group B, as the cause of diseases classified elsewhere: Secondary | ICD-10-CM | POA: Insufficient documentation

## 2020-06-28 DIAGNOSIS — Z3A37 37 weeks gestation of pregnancy: Secondary | ICD-10-CM

## 2020-06-28 DIAGNOSIS — Z3483 Encounter for supervision of other normal pregnancy, third trimester: Secondary | ICD-10-CM

## 2020-06-28 LAB — POCT URINALYSIS DIPSTICK OB
Spec Grav, UA: 1.01 (ref 1.010–1.025)
pH, UA: 6.5 (ref 5.0–8.0)

## 2020-06-28 NOTE — Patient Instructions (Signed)
Vaginal Delivery  Vaginal delivery means that you give birth by pushing your baby out of your birth canal (vagina). Your health care team will help you before, during, and after vaginaldelivery. Birth experiences are unique for every woman and every pregnancy, and birthexperiences vary depending on where you choose to give birth. What are the risks and benefits? Generally, this is safe. However, problems may occur, including: Bleeding. Infection. Damage to other structures such as vaginal tearing. Allergic reactions to medicines. Despite the risks, benefits of vaginal delivery include less risk of bleeding and infection and a shorter recovery time compared to a Cesarean delivery.Cesarean delivery, or C-section, is the surgical delivery of a baby. What happens when I arrive at the birth center or hospital? Once you are in labor and have been admitted into the hospital or birth center, your health care team may: Review your pregnancy history and any concerns that you have. Talk with you about your birth plan and discuss pain control options. Check your blood pressure, breathing, and heartbeat. Assess your baby's heartbeat. Monitor your uterus for contractions. Check whether your bag of water (amniotic sac) has broken (ruptured). Insert an IV into one of your veins. This may be used to give you fluids and medicines. Monitoring Your health care team may assess your contractions (uterine monitoring) and your baby's heart rate (fetal monitoring). You may need to be monitored: Often, but not continuously (intermittently). All the time or for long periods at a time (continuously). Continuous monitoring may be needed if: You are taking certain medicines, such as medicine to relieve pain or make your contractions stronger. You have pregnancy or labor complications. Monitoring may be done by: Placing a special stethoscope or a handheld monitoring device on your abdomen to check your baby's heartbeat  and to check for contractions. Placing monitors on your abdomen (external monitors) to record your baby's heartbeat and the frequency and length of contractions. Placing monitors inside your uterus through your vagina (internal monitors) to record your baby's heartbeat and the frequency, length, and strength of your contractions. Depending on the type of monitor, it may remain in your uterus or on your baby's head until birth. Telemetry. This is a type of continuous monitoring that can be done with external or internal monitors. Instead of having to stay in bed, you are able to move around. Physical exam Your health care team may perform frequent physical exams. This may include: Checking how and where your baby is positioned in your uterus. Checking your cervix to determine: Whether it is thinning out (effacing). Whether it is opening up (dilating). What happens during labor and delivery?  Normal labor and delivery is divided into the following three stages: Stage 1 This is the longest stage of labor. Throughout this stage, you will feel contractions. Contractions generally feel mild, infrequent, and irregular at first. They get stronger, more frequent, and more regular as you move through this stage. You may have contractions about every 2-3 minutes. This stage ends when your cervix is completely dilated to 4 inches (10 cm) and completely effaced. Stage 2 This stage starts once your cervix is completely effaced and dilated and lasts until the delivery of your baby. This is the stage where you will feel an urge to push your baby out of your vagina. You may feel stretching and burning pain, especially when the widest part of your baby's head passes through the vaginal opening (crowning). Once your baby is delivered, the umbilical cord will be clamped and cut.  Timing of cutting the cord will depend on your wishes, your baby's health, and your health care provider's practices. Your baby will be  placed on your bare chest (skin-to-skin contact) in an upright position and covered with a warm blanket. If you are choosing to breastfeed, watch your baby for feeding cues, like rooting or sucking, and help the baby to your breast for his or her first feeding. Stage 3 This stage starts immediately after the birth of your baby and ends after you deliver the placenta. This stage may take anywhere from 5 to 30 minutes. After your baby has been delivered, you will feel contractions as your body expels the placenta. These contractions also help your uterus get smaller and reduce bleeding. What can I expect after labor and delivery? After labor is over, you and your baby will be assessed closely until you are ready to go home. Your health care team will teach you how to care for yourself and your baby. You and your baby may be encouraged to stay in the same room (rooming in) during your hospital stay. This will help promote early bonding and successful breastfeeding. Your uterus will be checked and massaged regularly (fundal massage). You may continue to receive fluids and medicines through an IV. You will have some soreness and pain in your abdomen, vagina, and the area of skin between your vaginal opening and your anus (perineum). If an incision was made near your vagina (episiotomy) or if you had some vaginal tearing during delivery, cold compresses may be placed on your episiotomy or your tear. This helps to reduce pain and swelling. It is normal to have vaginal bleeding after delivery. Wear a sanitary pad for vaginal bleeding and discharge. Summary Vaginal delivery means that you will give birth by pushing your baby out of your birth canal (vagina). Your health care team will monitor you and your baby throughout the stages of labor. After you deliver your baby, your health care team will continue to assess you and your baby to ensure you are both recovering as expected after delivery. This  information is not intended to replace advice given to you by your health care provider. Make sure you discuss any questions you have with your healthcare provider. Document Revised: 11/21/2019 Document Reviewed: 11/21/2019 Elsevier Patient Education  2022 [employer] Inc.    Fetal Movement Counts Patient Name: ________________________________________________ Patient DueDate: ____________________ What is a fetal movement count?  A fetal movement count is the number of times that you feel your baby move during a certain amount of time. This may also be called a fetal kick count. A fetal movement count is recommended for every pregnant woman. You may be askedto start counting fetal movements as early as week 28 of your pregnancy. Pay attention to when your baby is most active. You may notice your baby's sleep and wake cycles. You may also notice things that make your baby move more. You should do a fetal movement count: When your baby is normally most active. At the same time each day. A good time to count movements is while you are resting, after having somethingto eat and drink. How do I count fetal movements? Find a quiet, comfortable area. Sit, or lie down on your side. Write down the date, the start time and stop time, and the number of movements that you felt between those two times. Take this information with you to your health care visits. Write down your start time when you feel the first movement.  Count kicks, flutters, swishes, rolls, and jabs. You should feel at least 10 movements. You may stop counting after you have felt 10 movements, or if you have been counting for 2 hours. Write down the stop time. If you do not feel 10 movements in 2 hours, contact your health care provider for further instructions. Your health care provider may want to do additional tests to assess your baby's well-being. Contact a health care provider if: You feel fewer than 10 movements in 2 hours. Your baby  is not moving like he or she usually does. Date: ____________ Start time: ____________ Stop time: ____________ Movements:____________ Date: ____________ Start time: ____________ Stop time: ____________ Movements:____________ Date: ____________ Start time: ____________ Stop time: ____________ Movements:____________ Date: ____________ Start time: ____________ Stop time: ____________ Movements:____________ Date: ____________ Start time: ____________ Stop time: ____________ Movements:____________ Date: ____________ Start time: ____________ Stop time: ____________ Movements:____________ Date: ____________ Start time: ____________ Stop time: ____________ Movements:____________ Date: ____________ Start time: ____________ Stop time: ____________ Movements:____________ Date: ____________ Start time: ____________ Stop time: ____________ Movements:____________ This information is not intended to replace advice given to you by your health care provider. Make sure you discuss any questions you have with your healthcare provider. Document Revised: 08/12/2018 Document Reviewed: 08/12/2018 Elsevier Patient Education  2022 [employer].

## 2020-06-28 NOTE — Progress Notes (Signed)
ROB: She has been having some strong Braxton Hick's. She is doing well today, no concerns. Does not want to be checked today.

## 2020-06-28 NOTE — Progress Notes (Signed)
ROB-Reports BHCs and mild hand/foot swelling. Discussed home treatment measures. Plan for side lying release and jiggling next visit. Education regarding GBS positive status and treatment protocols, patient verbalized understanding concerned regarding possible 48 hour hospital stay-previous home birth x 2. Reviewed red flag symptoms and when to call. RTC x 1 week for ROB or sooner if needed.

## 2020-07-05 ENCOUNTER — Encounter: Payer: Self-pay | Admitting: Certified Nurse Midwife

## 2020-07-05 ENCOUNTER — Other Ambulatory Visit: Payer: Self-pay

## 2020-07-05 ENCOUNTER — Ambulatory Visit (INDEPENDENT_AMBULATORY_CARE_PROVIDER_SITE_OTHER): Payer: 59 | Admitting: Certified Nurse Midwife

## 2020-07-05 VITALS — BP 136/79 | HR 98 | Wt 173.2 lb

## 2020-07-05 DIAGNOSIS — B951 Streptococcus, group B, as the cause of diseases classified elsewhere: Secondary | ICD-10-CM

## 2020-07-05 DIAGNOSIS — Z3483 Encounter for supervision of other normal pregnancy, third trimester: Secondary | ICD-10-CM

## 2020-07-05 DIAGNOSIS — Z3A38 38 weeks gestation of pregnancy: Secondary | ICD-10-CM

## 2020-07-05 LAB — POCT URINALYSIS DIPSTICK OB
Glucose, UA: NEGATIVE
Leukocytes, UA: NEGATIVE
POC,PROTEIN,UA: NEGATIVE
Spec Grav, UA: 1.01 (ref 1.010–1.025)
pH, UA: 6 (ref 5.0–8.0)

## 2020-07-05 NOTE — Progress Notes (Signed)
ROB-Doing well. Discussed fetal position and postdate care recommendations. Anticipatory guidance regarding course of care. Reviewed red flag symptoms and when to call. RTC x 1 week for Rob or sooner if needed.

## 2020-07-05 NOTE — Patient Instructions (Signed)
Fetal Movement Counts Patient Name: ________________________________________________ Patient DueDate: ____________________ What is a fetal movement count?  A fetal movement count is the number of times that you feel your baby move during a certain amount of time. This may also be called a fetal kick count. A fetal movement count is recommended for every pregnant woman. You may be askedto start counting fetal movements as early as week 28 of your pregnancy. Pay attention to when your baby is most active. You may notice your baby's sleep and wake cycles. You may also notice things that make your baby move more. You should do a fetal movement count: When your baby is normally most active. At the same time each day. A good time to count movements is while you are resting, after having somethingto eat and drink. How do I count fetal movements? Find a quiet, comfortable area. Sit, or lie down on your side. Write down the date, the start time and stop time, and the number of movements that you felt between those two times. Take this information with you to your health care visits. Write down your start time when you feel the first movement. Count kicks, flutters, swishes, rolls, and jabs. You should feel at least 10 movements. You may stop counting after you have felt 10 movements, or if you have been counting for 2 hours. Write down the stop time. If you do not feel 10 movements in 2 hours, contact your health care provider for further instructions. Your health care provider may want to do additional tests to assess your baby's well-being. Contact a health care provider if: You feel fewer than 10 movements in 2 hours. Your baby is not moving like he or she usually does. Date: ____________ Start time: ____________ Stop time: ____________ Movements:____________ Date: ____________ Start time: ____________ Stop time: ____________ Movements:____________ Date: ____________ Start time: ____________ Stop  time: ____________ Movements:____________ Date: ____________ Start time: ____________ Stop time: ____________ Movements:____________ Date: ____________ Start time: ____________ Stop time: ____________ Movements:____________ Date: ____________ Start time: ____________ Stop time: ____________ Movements:____________ Date: ____________ Start time: ____________ Stop time: ____________ Movements:____________ Date: ____________ Start time: ____________ Stop time: ____________ Movements:____________ Date: ____________ Start time: ____________ Stop time: ____________ Movements:____________ This information is not intended to replace advice given to you by your health care provider. Make sure you discuss any questions you have with your healthcare provider. Document Revised: 08/12/2018 Document Reviewed: 08/12/2018 Elsevier Patient Education  2022 [employer] Inc.  

## 2020-07-05 NOTE — Progress Notes (Signed)
ROB: Patient is doing well, no concerns. Prefers not to be checked until 40 weeks.

## 2020-07-12 ENCOUNTER — Other Ambulatory Visit: Payer: Self-pay

## 2020-07-12 ENCOUNTER — Ambulatory Visit (INDEPENDENT_AMBULATORY_CARE_PROVIDER_SITE_OTHER): Payer: 59 | Admitting: Certified Nurse Midwife

## 2020-07-12 ENCOUNTER — Encounter: Payer: Self-pay | Admitting: Certified Nurse Midwife

## 2020-07-12 VITALS — BP 126/75 | HR 89 | Wt 174.5 lb

## 2020-07-12 DIAGNOSIS — Z3483 Encounter for supervision of other normal pregnancy, third trimester: Secondary | ICD-10-CM

## 2020-07-12 DIAGNOSIS — Z3A39 39 weeks gestation of pregnancy: Secondary | ICD-10-CM

## 2020-07-12 LAB — POCT URINALYSIS DIPSTICK OB
Blood, UA: NEGATIVE
Glucose, UA: NEGATIVE
POC,PROTEIN,UA: NEGATIVE
Spec Grav, UA: 1.01 (ref 1.010–1.025)
pH, UA: 6.5 (ref 5.0–8.0)

## 2020-07-12 NOTE — Progress Notes (Signed)
ROB: She is doing well, has had frequent Braxton Hick's contractions. No new concerns today. Does not want to be checked today.

## 2020-07-12 NOTE — Patient Instructions (Signed)
Fetal Movement Counts Patient Name: ________________________________________________ Patient DueDate: ____________________ What is a fetal movement count?  A fetal movement count is the number of times that you feel your baby move during a certain amount of time. This may also be called a fetal kick count. A fetal movement count is recommended for every pregnant woman. You may be askedto start counting fetal movements as early as week 28 of your pregnancy. Pay attention to when your baby is most active. You may notice your baby's sleep and wake cycles. You may also notice things that make your baby move more. You should do a fetal movement count: When your baby is normally most active. At the same time each day. A good time to count movements is while you are resting, after having somethingto eat and drink. How do I count fetal movements? Find a quiet, comfortable area. Sit, or lie down on your side. Write down the date, the start time and stop time, and the number of movements that you felt between those two times. Take this information with you to your health care visits. Write down your start time when you feel the first movement. Count kicks, flutters, swishes, rolls, and jabs. You should feel at least 10 movements. You may stop counting after you have felt 10 movements, or if you have been counting for 2 hours. Write down the stop time. If you do not feel 10 movements in 2 hours, contact your health care provider for further instructions. Your health care provider may want to do additional tests to assess your baby's well-being. Contact a health care provider if: You feel fewer than 10 movements in 2 hours. Your baby is not moving like he or she usually does. Date: ____________ Start time: ____________ Stop time: ____________ Movements:____________ Date: ____________ Start time: ____________ Stop time: ____________ Movements:____________ Date: ____________ Start time: ____________ Stop  time: ____________ Movements:____________ Date: ____________ Start time: ____________ Stop time: ____________ Movements:____________ Date: ____________ Start time: ____________ Stop time: ____________ Movements:____________ Date: ____________ Start time: ____________ Stop time: ____________ Movements:____________ Date: ____________ Start time: ____________ Stop time: ____________ Movements:____________ Date: ____________ Start time: ____________ Stop time: ____________ Movements:____________ Date: ____________ Start time: ____________ Stop time: ____________ Movements:____________ This information is not intended to replace advice given to you by your health care provider. Make sure you discuss any questions you have with your healthcare provider. Document Revised: 08/12/2018 Document Reviewed: 08/12/2018 Elsevier Patient Education  2022 [employer] Inc.  

## 2020-07-13 NOTE — Progress Notes (Signed)
ROB-Doing well, no questions or concerns. Will submit Birth Plan via MyChart. Anticipatory guidance regarding postdates care. Encouraged twice daily kick counts. Reviewed red flag symptoms and when to call. RTC x 1 week for NST and ROB or sooner if needed.

## 2020-07-14 ENCOUNTER — Inpatient Hospital Stay: Admit: 2020-07-14 | Payer: 59

## 2020-07-17 ENCOUNTER — Encounter: Payer: Self-pay | Admitting: Obstetrics and Gynecology

## 2020-07-17 ENCOUNTER — Inpatient Hospital Stay: Payer: 59 | Admitting: Anesthesiology

## 2020-07-17 ENCOUNTER — Telehealth: Payer: Self-pay

## 2020-07-17 ENCOUNTER — Inpatient Hospital Stay
Admission: EM | Admit: 2020-07-17 | Discharge: 2020-07-19 | DRG: 807 | Disposition: A | Discharge status: Home / Self Care | Payer: 59 | Attending: Certified Nurse Midwife | Admitting: Certified Nurse Midwife

## 2020-07-17 ENCOUNTER — Other Ambulatory Visit: Payer: Self-pay

## 2020-07-17 DIAGNOSIS — O09899 Supervision of other high risk pregnancies, unspecified trimester: Secondary | ICD-10-CM

## 2020-07-17 DIAGNOSIS — Z3A40 40 weeks gestation of pregnancy: Secondary | ICD-10-CM | POA: Diagnosis not present

## 2020-07-17 DIAGNOSIS — Z2839 Other underimmunization status: Secondary | ICD-10-CM

## 2020-07-17 DIAGNOSIS — O99824 Streptococcus B carrier state complicating childbirth: Principal | ICD-10-CM | POA: Diagnosis present

## 2020-07-17 DIAGNOSIS — O0930 Supervision of pregnancy with insufficient antenatal care, unspecified trimester: Secondary | ICD-10-CM

## 2020-07-17 DIAGNOSIS — Z20822 Contact with and (suspected) exposure to covid-19: Secondary | ICD-10-CM | POA: Diagnosis present

## 2020-07-17 DIAGNOSIS — B951 Streptococcus, group B, as the cause of diseases classified elsewhere: Secondary | ICD-10-CM | POA: Diagnosis not present

## 2020-07-17 DIAGNOSIS — O26893 Other specified pregnancy related conditions, third trimester: Secondary | ICD-10-CM | POA: Diagnosis present

## 2020-07-17 DIAGNOSIS — O98813 Other maternal infectious and parasitic diseases complicating pregnancy, third trimester: Secondary | ICD-10-CM | POA: Diagnosis not present

## 2020-07-17 DIAGNOSIS — Z6740 Type O blood, Rh positive: Secondary | ICD-10-CM | POA: Diagnosis present

## 2020-07-17 DIAGNOSIS — Z3490 Encounter for supervision of normal pregnancy, unspecified, unspecified trimester: Secondary | ICD-10-CM

## 2020-07-17 LAB — CBC
HCT: 39.7 % (ref 36.0–46.0)
Hemoglobin: 13.8 g/dL (ref 12.0–15.0)
MCH: 30.3 pg (ref 26.0–34.0)
MCHC: 34.8 g/dL (ref 30.0–36.0)
MCV: 87.1 fL (ref 80.0–100.0)
Platelets: 284 10*3/uL (ref 150–400)
RBC: 4.56 MIL/uL (ref 3.87–5.11)
RDW: 13.3 % (ref 11.5–15.5)
WBC: 14.9 10*3/uL — ABNORMAL HIGH (ref 4.0–10.5)
nRBC: 0 % (ref 0.0–0.2)

## 2020-07-17 LAB — RESP PANEL BY RT-PCR (FLU A&B, COVID) ARPGX2
Influenza A by PCR: NEGATIVE
Influenza B by PCR: NEGATIVE
SARS Coronavirus 2 by RT PCR: NEGATIVE

## 2020-07-17 LAB — COMPREHENSIVE METABOLIC PANEL
ALT: 20 U/L (ref 0–44)
AST: 20 U/L (ref 15–41)
Albumin: 3.1 g/dL — ABNORMAL LOW (ref 3.5–5.0)
Alkaline Phosphatase: 131 U/L — ABNORMAL HIGH (ref 38–126)
Anion gap: 7 (ref 5–15)
BUN: 9 mg/dL (ref 6–20)
CO2: 21 mmol/L — ABNORMAL LOW (ref 22–32)
Calcium: 9 mg/dL (ref 8.9–10.3)
Chloride: 109 mmol/L (ref 98–111)
Creatinine, Ser: 0.48 mg/dL (ref 0.44–1.00)
GFR, Estimated: >60 mL/min (ref >60)
Glucose, Bld: 101 mg/dL — ABNORMAL HIGH (ref 70–99)
Potassium: 3.6 mmol/L (ref 3.5–5.1)
Sodium: 137 mmol/L (ref 135–145)
Total Bilirubin: 0.6 mg/dL (ref 0.3–1.2)
Total Protein: 6.6 g/dL (ref 6.5–8.1)

## 2020-07-17 LAB — TYPE AND SCREEN
ABO/RH(D): O POS
Antibody Screen: NEGATIVE

## 2020-07-17 LAB — PROTEIN / CREATININE RATIO, URINE
Creatinine, Urine: 18 mg/dL
Total Protein, Urine: <6 mg/dL

## 2020-07-17 LAB — ABO/RH: ABO/RH(D): O POS

## 2020-07-17 MED ORDER — EPHEDRINE 5 MG/ML INJ
10.0000 mg | INTRAVENOUS | Status: DC | PRN
  Filled 2020-07-17: qty 2

## 2020-07-17 MED ORDER — ACETAMINOPHEN 325 MG PO TABS: 650.0000 mg | ORAL_TABLET | ORAL | Status: DC | PRN

## 2020-07-17 MED ORDER — OXYTOCIN 10 UNIT/ML IJ SOLN
INTRAMUSCULAR | Status: AC
  Filled 2020-07-17: qty 2

## 2020-07-17 MED ORDER — SODIUM CHLORIDE 0.9% FLUSH: 3.0000 mL | INTRAVENOUS | Status: DC | PRN

## 2020-07-17 MED ORDER — BUPIVACAINE HCL (PF) 0.25 % IJ SOLN
INTRAMUSCULAR | Status: DC | PRN
  Administered 2020-07-17: 3 mL via EPIDURAL
  Administered 2020-07-17: 5 mL via EPIDURAL

## 2020-07-17 MED ORDER — SODIUM CHLORIDE 0.9 % IV SOLN: 250.0000 mL | INTRAVENOUS | Status: DC | PRN

## 2020-07-17 MED ORDER — AMMONIA AROMATIC IN INHA
RESPIRATORY_TRACT | Status: AC
  Filled 2020-07-17: qty 10

## 2020-07-17 MED ORDER — MISOPROSTOL 200 MCG PO TABS
ORAL_TABLET | ORAL | Status: AC
  Filled 2020-07-17: qty 4

## 2020-07-17 MED ORDER — OXYTOCIN-SODIUM CHLORIDE 30-0.9 UT/500ML-% IV SOLN
2.5000 [IU]/h | INTRAVENOUS | Status: DC
  Filled 2020-07-17: qty 500

## 2020-07-17 MED ORDER — LIDOCAINE HCL (PF) 1 % IJ SOLN
INTRAMUSCULAR | Status: AC
  Filled 2020-07-17: qty 30

## 2020-07-17 MED ORDER — OXYCODONE-ACETAMINOPHEN 5-325 MG PO TABS: 1.0000 | ORAL_TABLET | ORAL | Status: DC | PRN

## 2020-07-17 MED ORDER — OXYCODONE-ACETAMINOPHEN 5-325 MG PO TABS: 2.0000 | ORAL_TABLET | ORAL | Status: DC | PRN

## 2020-07-17 MED ORDER — LACTATED RINGERS IV SOLN: INTRAVENOUS | Status: DC

## 2020-07-17 MED ORDER — ONDANSETRON HCL 4 MG/2ML IJ SOLN: 4.0000 mg | Freq: Four times a day (QID) | INTRAMUSCULAR | Status: DC | PRN

## 2020-07-17 MED ORDER — DIPHENHYDRAMINE HCL 50 MG/ML IJ SOLN
12.5000 mg | INTRAMUSCULAR | Status: DC | PRN
Start: 2020-07-17 — End: 2020-07-18

## 2020-07-17 MED ORDER — SODIUM CHLORIDE 0.9 % IV SOLN
1.0000 g | INTRAVENOUS | Status: DC
  Administered 2020-07-18: 1 g via INTRAVENOUS
  Filled 2020-07-17 (×6): qty 1000

## 2020-07-17 MED ORDER — LIDOCAINE HCL (PF) 1 % IJ SOLN
INTRAMUSCULAR | Status: DC | PRN
  Administered 2020-07-17: 3 mL via SUBCUTANEOUS

## 2020-07-17 MED ORDER — SODIUM CHLORIDE 0.9 % IV SOLN
2.0000 g | Freq: Once | INTRAVENOUS | Status: AC
  Administered 2020-07-17: 2 g via INTRAVENOUS
  Filled 2020-07-17: qty 2000

## 2020-07-17 MED ORDER — FENTANYL 2.5 MCG/ML W/ROPIVACAINE 0.15% IN NS 100 ML EPIDURAL (ARMC)
EPIDURAL | Status: AC
  Filled 2020-07-17: qty 100

## 2020-07-17 MED ORDER — PHENYLEPHRINE 40 MCG/ML (10ML) SYRINGE FOR IV PUSH (FOR BLOOD PRESSURE SUPPORT)
80.0000 ug | PREFILLED_SYRINGE | INTRAVENOUS | Status: DC | PRN
  Filled 2020-07-17: qty 10

## 2020-07-17 MED ORDER — BUTORPHANOL TARTRATE 1 MG/ML IJ SOLN: 1.0000 mg | INTRAMUSCULAR | Status: DC | PRN

## 2020-07-17 MED ORDER — SOD CITRATE-CITRIC ACID 500-334 MG/5ML PO SOLN: 30.0000 mL | ORAL | Status: DC | PRN

## 2020-07-17 MED ORDER — LACTATED RINGERS IV SOLN
500.0000 mL | Freq: Once | INTRAVENOUS | Status: AC
  Administered 2020-07-17: 500 mL via INTRAVENOUS

## 2020-07-17 MED ORDER — LIDOCAINE-EPINEPHRINE (PF) 1.5 %-1:200000 IJ SOLN
INTRAMUSCULAR | Status: DC | PRN
  Administered 2020-07-17: 4 mL via EPIDURAL

## 2020-07-17 MED ORDER — OXYTOCIN BOLUS FROM INFUSION: 333.0000 mL | Freq: Once | INTRAVENOUS | Status: DC

## 2020-07-17 MED ORDER — FENTANYL 2.5 MCG/ML W/ROPIVACAINE 0.15% IN NS 100 ML EPIDURAL (ARMC)
12.0000 mL/h | EPIDURAL | Status: DC
  Administered 2020-07-17: 12 mL/h via EPIDURAL

## 2020-07-17 MED ORDER — LACTATED RINGERS IV SOLN
500.0000 mL | INTRAVENOUS | Status: DC | PRN
  Administered 2020-07-18: 500 mL via INTRAVENOUS

## 2020-07-17 MED ORDER — SODIUM CHLORIDE 0.9% FLUSH: 3.0000 mL | Freq: Two times a day (BID) | INTRAVENOUS | Status: DC

## 2020-07-17 MED ORDER — LIDOCAINE HCL (PF) 1 % IJ SOLN: 30.0000 mL | INTRAMUSCULAR | Status: DC | PRN

## 2020-07-17 NOTE — OB Triage Note (Signed)
Patient arrived in triage with c/o contractions since this morning that have gotten progressively stronger. Patient states contractions have been about 5 mins apart since approx 1800, rates pain 7/10. Patient reports good fetal movement, denies leaking of fluid and vaginal bleeding, other than bloody show noted this morning. Monitors applied and assessing. Initial FHT 127.

## 2020-07-17 NOTE — Telephone Encounter (Signed)
Pt call with c/ o ctx Q 10 min lasting about 1 min . Some times can talk through them, some times not. Discussed going info L&D  for evaluation with ctx 2-5 min apart x 1 hr . With ctx that are 5 or above on pain scale, loss of fluid , or decreased fetal movement. She verbalizes and agrees to plan.   Doreene Burke, CNM

## 2020-07-17 NOTE — Anesthesia Procedure Notes (Signed)
Epidural Patient location during procedure: OB  Staffing Anesthesiologist: Piscitello, Cleda Mccreedy, MD Performed: anesthesiologist   Preanesthetic Checklist Completed: patient identified, IV checked, site marked, risks and benefits discussed, surgical consent, monitors and equipment checked, pre-op evaluation and timeout performed  Epidural Patient position: sitting Prep: ChloraPrep Patient monitoring: heart rate, continuous pulse ox and blood pressure Approach: midline Location: L3-L4 Injection technique: LOR saline  Needle:  Needle type: Tuohy  Needle gauge: 17 G Needle length: 9 cm and 9 Needle insertion depth: 5 cm Catheter type: closed end flexible Catheter size: 19 Gauge Catheter at skin depth: 11 cm Test dose: negative and 1.5% lidocaine with Epi 1:200 K  Assessment Sensory level: T10 Events: blood not aspirated, injection not painful, no injection resistance, no paresthesia and negative IV test  Additional Notes 1st attempt Pt. Evaluated and documentation done after procedure finished. Patient identified. Risks/Benefits/Options discussed with patient including but not limited to bleeding, infection, nerve damage, paralysis, failed block, incomplete pain control, headache, blood pressure changes, nausea, vomiting, reactions to medication both or allergic, itching and postpartum back pain. Confirmed with bedside nurse the patient's most recent platelet count. Confirmed with patient that they are not currently taking any anticoagulation, have any bleeding history or any family history of bleeding disorders. Patient expressed understanding and wished to proceed. All questions were answered. Sterile technique was used throughout the entire procedure. Please see nursing notes for vital signs. Test dose was given through epidural catheter and negative prior to continuing to dose epidural or start infusion. Warning signs of high block given to the patient including shortness of  breath, tingling/numbness in hands, complete motor block, or any concerning symptoms with instructions to call for help. Patient was given instructions on fall risk and not to get out of bed. All questions and concerns addressed with instructions to call with any issues or inadequate analgesia.    Patient tolerated the insertion well without immediate complications.Reason for block:procedure for pain

## 2020-07-17 NOTE — Anesthesia Preprocedure Evaluation (Signed)
Anesthesia Evaluation  Patient identified by MRN, date of birth, ID band Patient awake    Reviewed: Allergy & Precautions, H&P , NPO status , Patient's Chart, lab work & pertinent test results, reviewed documented beta blocker date and time   Airway Mallampati: II  TM Distance: >3 FB Neck ROM: full    Dental no notable dental hx. (+) Teeth Intact   Pulmonary neg pulmonary ROS, Current Smoker,    Pulmonary exam normal breath sounds clear to auscultation       Cardiovascular Exercise Tolerance: Good negative cardio ROS   Rhythm:regular Rate:Normal     Neuro/Psych negative neurological ROS  negative psych ROS   GI/Hepatic negative GI ROS, Neg liver ROS,   Endo/Other  negative endocrine ROSdiabetes  Renal/GU      Musculoskeletal   Abdominal   Peds  Hematology negative hematology ROS (+)   Anesthesia Other Findings   Reproductive/Obstetrics (+) Pregnancy                             Anesthesia Physical Anesthesia Plan  ASA: 2  Anesthesia Plan: Epidural   Post-op Pain Management:    Induction:   PONV Risk Score and Plan:   Airway Management Planned:   Additional Equipment:   Intra-op Plan:   Post-operative Plan:   Informed Consent: I have reviewed the patients History and Physical, chart, labs and discussed the procedure including the risks, benefits and alternatives for the proposed anesthesia with the patient or authorized representative who has indicated his/her understanding and acceptance.       Plan Discussed with:   Anesthesia Plan Comments:         Anesthesia Quick Evaluation  

## 2020-07-17 NOTE — Telephone Encounter (Signed)
Ob 40 3/7  Last seen on 7/7 by JML.   Having ctx on/off since 5 am. Th e last hour they are 10 minutes apart. Lasting about a minute.  Had bloody show this a.m. Nothing since. Pain is a 6/ out of 10. No LOF.   Wanted to make provider aware. Pls advise.

## 2020-07-17 NOTE — H&P (Signed)
Obstetric History and Physical  Almer Littleton is a 30 y.o. [phone] with IUP at [redacted]w[redacted]d presenting with painful contraction all day that increased in frequency around 1800.   Patient states she has been having  regular, every five (5) minutes contractions, none vaginal bleeding, intact membranes, with active fetal movement.    Denies difficulty breathing or respiratory distress, chest pain, dysuria and leg pain or swelling.   Prenatal Course  Source of Care: [facility]-initial visit at 17 weeks, total visits: 11   Pregnancy complications or risks: Late prenatal care, GBS positive, Rh positive, Rubella non-immune  Prenatal labs and studies:  ABO, Rh: --/--/O POS (07/12 2045)  Antibody: NEG (07/12 2045)  Varicella: 931 (02/07 1619)  Rubella: <0.90 (02/07 1619)  RPR: Non Reactive (06/15 1538)   HBsAg: Negative (06/15 1538)   HIV: Non Reactive (06/15 1538)   BVQ:XIHWTUUE/-- (06/16 0802)  1 hr Glucola: 79 (04/20 1102)  Genetic screening: Declined  Anatomy US: Complete, normal (03/22 1600)  Past Medical History:  Diagnosis Date   Pyloric atresia     Past Surgical History:  Procedure Laterality Date   pyloric atresia surgery      OB History  Gravida Para Term Preterm AB Living  4 2 2   1 2   SAB IAB Ectopic Multiple Live Births  1       2    # Outcome Date GA Lbr Len/2nd Weight Sex Delivery Anes PTL Lv  4 Current           3 Term 04/29/18   3402 g M Vag-Spont  N LIV  2 Term 09/14/15   3033 g F Vag-Spont  N LIV  1 SAB 12/03/14            Social History   Socioeconomic History   Marital status: Married    Spouse name: Not on file   Number of children: Not on file   Years of education: Not on file   Highest education level: Not on file  Occupational History   Not on file  Tobacco Use   Smoking status: Never   Smokeless tobacco: Never  Vaping Use   Vaping Use: Never used  Substance and Sexual Activity   Alcohol use: Not Currently   Drug use: Never   Sexual  activity: Yes    Partners: Male    Birth control/protection: None  Other Topics Concern   Not on file  Social History Narrative   Not on file   Social Determinants of Health   Financial Resource Strain: Not on file  Food Insecurity: Not on file  Transportation Needs: Not on file  Physical Activity: Not on file  Stress: Not on file  Social Connections: Not on file    Family History  Problem Relation Age of Onset   Healthy Mother    Healthy Father     Medications Prior to Admission  Medication Sig Dispense Refill Last Dose   Magnesium Oxide POWD Take 1 Scoop by mouth 2 (two) times daily. High absorption Magnesium powder lysinate gylcinate 100% chelated   Past Week   Prenatal Vit-Fe Fumarate-FA (MULTIVITAMIN-PRENATAL) 27-0.8 MG TABS tablet Take 1 tablet by mouth daily at 12 noon.   07/16/2020    Allergies  Allergen Reactions   Latex Rash    Review of Systems: Negative except for what is mentioned in HPI.  Physical Exam:  Temp:  [98.5 F (36.9 C)] 98.5 F (36.9 C) (07/12 2002) Pulse Rate:  [88-91] 89 (07/12 2020)  Resp:  [16] 16 (07/12 2002) BP: (138-155)/(71-77) 138/73 (07/12 2020) Weight:  [79.2 kg] 79.2 kg (07/12 2002)  GENERAL: Well-developed, well-nourished female in no acute distress.   LUNGS: Clear to auscultation bilaterally.   HEART: Regular rate and rhythm.  ABDOMEN: Soft, nontender, nondistended, gravid.  EXTREMITIES: Nontender, no edema, 2+ distal pulses.  Cervical Exam: Dilation: 7.5 Effacement (%): 80 Cervical Position: Middle Station: -1 Presentation: Vertex  FHR Category I  Contractions: Every four (4) to eight (8) minutes, soft resting tone   Pertinent Labs/Studies:   Results for orders placed or performed during the hospital encounter of 07/17/20 (from the past 24 hour(s))  CBC     Status: Abnormal   Collection Time: 07/17/20  8:45 PM  Result Value Ref Range   WBC 14.9 (H) 4.0 - 10.5 K/uL   RBC 4.56 3.87 - 5.11 MIL/uL   Hemoglobin  13.8 12.0 - 15.0 g/dL   HCT 38.1 01.7 - 51.0 %   MCV 87.1 80.0 - 100.0 fL   MCH 30.3 26.0 - 34.0 pg   MCHC 34.8 30.0 - 36.0 g/dL   RDW 25.8 52.7 - 78.2 %   Platelets 284 150 - 400 K/uL   nRBC 0.0 0.0 - 0.2 %  Type and screen [facility]     Status: None   Collection Time: 07/17/20  8:45 PM  Result Value Ref Range   ABO/RH(D) O POS    Antibody Screen NEG    Sample Expiration      07/20/2020,2359 Performed at [facility], [address] [place], [place] [address]     Assessment :  Ashely Goosby is a 30 y.o. [phone] at [redacted]w[redacted]d being admitted for labor, Rh positive, GBS positive  FHR Category I  Plan:  Admit to birthing suites, see orders.   Labor: Expectant management.  Induction/Augmentation as needed, per protocol.  Plan: Hopeful for vaginal birth.   Dr. Logan Bores notified of admission and plan of care.    Gunnar Bulla, CNM [facility], [facility] 07/17/20 9:45 PM

## 2020-07-18 ENCOUNTER — Encounter: Payer: Self-pay | Admitting: Certified Nurse Midwife

## 2020-07-18 DIAGNOSIS — B951 Streptococcus, group B, as the cause of diseases classified elsewhere: Secondary | ICD-10-CM

## 2020-07-18 DIAGNOSIS — O98813 Other maternal infectious and parasitic diseases complicating pregnancy, third trimester: Secondary | ICD-10-CM

## 2020-07-18 DIAGNOSIS — Z3A40 40 weeks gestation of pregnancy: Secondary | ICD-10-CM

## 2020-07-18 LAB — RPR: RPR Ser Ql: NONREACTIVE

## 2020-07-18 MED ORDER — ONDANSETRON HCL 4 MG/2ML IJ SOLN: 4.0000 mg | INTRAMUSCULAR | Status: DC | PRN

## 2020-07-18 MED ORDER — PRENATAL MULTIVITAMIN CH
1.0000 | ORAL_TABLET | Freq: Every day | ORAL | Status: DC
  Administered 2020-07-18: 1 via ORAL
  Filled 2020-07-18: qty 1

## 2020-07-18 MED ORDER — ONDANSETRON HCL 4 MG PO TABS: 4.0000 mg | ORAL_TABLET | ORAL | Status: DC | PRN

## 2020-07-18 MED ORDER — COCONUT OIL OIL: 1.0000 "application " | TOPICAL_OIL | Status: DC | PRN

## 2020-07-18 MED ORDER — SODIUM CHLORIDE 0.9 % IV SOLN: 250.0000 mL | INTRAVENOUS | Status: DC | PRN

## 2020-07-18 MED ORDER — SENNOSIDES-DOCUSATE SODIUM 8.6-50 MG PO TABS
2.0000 | ORAL_TABLET | ORAL | Status: DC
  Administered 2020-07-18: 2 via ORAL
  Filled 2020-07-18: qty 2

## 2020-07-18 MED ORDER — SODIUM CHLORIDE 0.9% FLUSH: 3.0000 mL | Freq: Two times a day (BID) | INTRAVENOUS | Status: DC

## 2020-07-18 MED ORDER — SIMETHICONE 80 MG PO CHEW: 80.0000 mg | CHEWABLE_TABLET | ORAL | Status: DC | PRN

## 2020-07-18 MED ORDER — IBUPROFEN 600 MG PO TABS
600.0000 mg | ORAL_TABLET | Freq: Four times a day (QID) | ORAL | Status: DC
  Administered 2020-07-18 – 2020-07-19 (×4): 600 mg via ORAL
  Filled 2020-07-18 (×5): qty 1

## 2020-07-18 MED ORDER — DIPHENHYDRAMINE HCL 25 MG PO CAPS: 25.0000 mg | ORAL_CAPSULE | Freq: Four times a day (QID) | ORAL | Status: DC | PRN

## 2020-07-18 MED ORDER — SODIUM CHLORIDE 0.9% FLUSH
3.0000 mL | INTRAVENOUS | Status: DC | PRN
  Administered 2020-07-18: 3 mL via INTRAVENOUS

## 2020-07-18 MED ORDER — DIBUCAINE (PERIANAL) 1 % EX OINT: 1.0000 "application " | TOPICAL_OINTMENT | CUTANEOUS | Status: DC | PRN

## 2020-07-18 MED ORDER — BENZOCAINE-MENTHOL 20-0.5 % EX AERO
1.0000 "application " | INHALATION_SPRAY | CUTANEOUS | Status: DC | PRN
  Filled 2020-07-18: qty 56

## 2020-07-18 MED ORDER — METHYLERGONOVINE MALEATE 0.2 MG/ML IJ SOLN: 0.2000 mg | INTRAMUSCULAR | Status: DC | PRN

## 2020-07-18 MED ORDER — ACETAMINOPHEN 500 MG PO TABS: 1000.0000 mg | ORAL_TABLET | Freq: Four times a day (QID) | ORAL | Status: DC | PRN

## 2020-07-18 MED ORDER — METHYLERGONOVINE MALEATE 0.2 MG PO TABS
0.2000 mg | ORAL_TABLET | ORAL | Status: DC | PRN
Start: 2020-07-18 — End: 2020-07-19
  Administered 2020-07-18: 0.2 mg via ORAL
  Filled 2020-07-18 (×3): qty 1

## 2020-07-18 MED ORDER — WITCH HAZEL-GLYCERIN EX PADS: 1.0000 "application " | MEDICATED_PAD | CUTANEOUS | Status: DC | PRN

## 2020-07-18 NOTE — Progress Notes (Signed)
Progress Note - Vaginal Delivery  Tammy Burgess is a 30 y.o. [phone] now PP day 0 s/p Vaginal, Spontaneous  early this morning.  Subjective:  The patient reports no complaints, up ad lib, voiding, and tolerating PO   Objective:  Vital signs in last 24 hours: Temp:  [97.9 F (36.6 C)-98.5 F (36.9 C)] 97.9 F (36.6 C) (07/13 0739) Pulse Rate:  [78-106] 78 (07/13 0739) Resp:  [16-20] 20 (07/13 0739) BP: (115-155)/(54-79) 133/77 (07/13 0739) SpO2:  [95 %-99 %] 96 % (07/13 0739) Weight:  [79.2 kg] 79.2 kg (07/12 2002)  Physical Exam:  General: alert, cooperative, appears stated age, and no distress Lochia: appropriate Uterine Fundus: firm U-1    Data Review Recent Labs    07/17/20 2045  HGB 13.8  HCT 39.7    Assessment/Plan: Active Problems:   Late prenatal care   Supervision of normal pregnancy   Type O blood, Rh positive   Rubella non-immune status, antepartum   Group beta Strep positive   Normal labor   Vaginal delivery   [redacted] weeks gestation of pregnancy   Plan for discharge tomorrow   -- Continue routine PP care.     Doreene Burke, CNM  07/18/2020 7:51 AM

## 2020-07-18 NOTE — Progress Notes (Signed)
Patient ID: Tammy Burgess, female   DOB: May 20, 1990, 30 y.o.   MRN: [id number]  Tammy Burgess is a 30 y.o. [id number] at [redacted]w[redacted]d by LMP admitted for active labor  Subjective:  Patient reports intermittent pelvic pressure with contractions.   FOB at bedside for continuous labor support.   Denies difficulty breathing or respiratory distress, chest pain, abdominal pain, dysuria, and leg pain or swelling.   Objective:  Temp:  [98.5 F (36.9 C)] 98.5 F (36.9 C) (07/12 2230) Pulse Rate:  [86-102] 87 (07/12 2240) Resp:  [16] 16 (07/12 2230) BP: (131-155)/(61-79) 131/63 (07/12 2240) SpO2:  [95 %-98 %] 95 % (07/12 2250) Weight:  [79.2 kg] 79.2 kg (07/12 2002)  Fetal Wellbeing:  Category I  UC:   regular, every two (2) to six (6) minutes; soft resting tone  SVE:   Dilation: 10 Effacement (%): 100 Station: Plus 1 Exam by:: Adalaide Jaskolski, CNM  Labs: Lab Results  Component Value Date   WBC 14.9 (H) 07/17/2020   HGB 13.8 07/17/2020   HCT 39.7 07/17/2020   MCV 87.1 07/17/2020   PLT 284 07/17/2020    Assessment:  Tammy Burgess is a 30 y.o. [id number] at [redacted]w[redacted]d admitted for labor, Rh positive, GBS positive   FHR Category I  Plan:  Patient wishes to complete second dose of GBS prophylaxis prior to pushing.   Reviewed red flag symptoms and when to call.   Continue orders as written.   Room prepared for second stage.    Tammy Burgess, CNM [facility], [facility] 07/18/2020, 12:42 AM

## 2020-07-19 ENCOUNTER — Other Ambulatory Visit: Payer: 59

## 2020-07-19 ENCOUNTER — Encounter: Payer: 59 | Admitting: Certified Nurse Midwife

## 2020-07-19 LAB — CBC
HCT: 37.4 % (ref 36.0–46.0)
Hemoglobin: 12.2 g/dL (ref 12.0–15.0)
MCH: 29.2 pg (ref 26.0–34.0)
MCHC: 32.6 g/dL (ref 30.0–36.0)
MCV: 89.5 fL (ref 80.0–100.0)
Platelets: 287 10*3/uL (ref 150–400)
RBC: 4.18 MIL/uL (ref 3.87–5.11)
RDW: 13.6 % (ref 11.5–15.5)
WBC: 11.9 10*3/uL — ABNORMAL HIGH (ref 4.0–10.5)
nRBC: 0 % (ref 0.0–0.2)

## 2020-07-19 MED ORDER — IBUPROFEN 600 MG PO TABS: 600.0000 mg | ORAL_TABLET | Freq: Four times a day (QID) | ORAL | 0 refills | Status: DC

## 2020-07-19 MED ORDER — ACETAMINOPHEN 500 MG PO TABS: 1000.0000 mg | ORAL_TABLET | Freq: Four times a day (QID) | ORAL | 0 refills | Status: DC | PRN

## 2020-07-19 NOTE — Lactation Note (Signed)
This note was copied from a baby's chart. Lactation Consultation Note  Patient Name: Tammy Burgess [id number] Date: 07/19/2020 Reason for consult: Initial assessment;Term Age:30 hours  Initial and discharge lactation visit for G4P3 who delivered via SVD 31 hours ago. Mom has over 59yr breastfeeding experience w/ each of her other children. Mom notes initial pain/tenderness for first week but no problems after that.  Yesterday Lactation was told mom was independent no concerns and declined lactation visit. Today RN states that baby was up all night, angry, and appeared hungry.   LC in room for follow-up and to provide assistance as needed Baby was in laid back/biological nursing position latched at the breast w/ flanged top/bottom lips. During conversation LC notes audible swallows, and baby staying alert and active at the breast. Mom voiced no overall concerns, some tenderness but thinks it's normal since her milk isn't "in" yet.  LC encouraged good position/alignment, breast massage/compression during feedings, and continue feeding on demand to aid in building supply appropriate for baby's needs. Reviewed normal course of lactation and milk supply/demand.  Mom had no follow-up questions or needs that were expressed. LC left information for outpatient lactation services and community breastfeeding support.  Maternal Data Has patient been taught Hand Expression?: Yes Does the patient have breastfeeding experience prior to this delivery?: Yes How long did the patient breastfeed?: 43yr with each other child  Feeding Mother's Current Feeding Choice: Breast Milk  LATCH Score Latch:  (Mom was independent w/ latch prior to [facility] entering)  Audible Swallowing: A few with stimulation  Type of Nipple: Everted at rest and after stimulation  Comfort (Breast/Nipple): Soft / non-tender  Hold (Positioning): No assistance needed to correctly position infant at breast.  LATCH Score:  9   Lactation Tools Discussed/Used    Interventions Interventions: Education  Discharge Discharge Education: Engorgement and breast care;Warning signs for feeding baby;Outpatient recommendation  Consult Status Consult Status: Complete    Danford Bad 07/19/2020, 9:36 AM

## 2020-07-19 NOTE — Anesthesia Postprocedure Evaluation (Signed)
Anesthesia Post Note  Patient: Tammy Burgess  Procedure(s) Performed: AN AD HOC LABOR EPIDURAL  Patient location during evaluation: Mother Baby Anesthesia Type: Epidural Level of consciousness: awake and alert Pain management: pain level controlled Vital Signs Assessment: post-procedure vital signs reviewed and stable Respiratory status: spontaneous breathing, nonlabored ventilation and respiratory function stable Cardiovascular status: stable Postop Assessment: no headache, no backache and epidural receding Anesthetic complications: no   No notable events documented.   Last Vitals:  Vitals:   07/18/20 1555 07/18/20 2332  BP: 139/83 126/67  Pulse: 76 74  Resp: 18 18  Temp: 36.7 C 36.4 C  SpO2:  99%    Last Pain:  Vitals:   07/19/20 0400  TempSrc:   PainSc: 1                  Chapin Arduini

## 2020-07-19 NOTE — Discharge Summary (Signed)
Obstetric Discharge Summary  Patient ID: Tammy Burgess MRN: [id number] DOB/AGE: 06-Dec-1990 30 y.o.   Date of Admission: 07/17/2020  Date of Discharge: 07/19/20  Admitting Diagnosis: Onset of Labor at [redacted]w[redacted]d  Secondary Diagnosis:   Patient Active Problem List   Diagnosis Date Noted   Vaginal delivery    [redacted] weeks gestation of pregnancy    Normal labor 07/17/2020   Group beta Strep positive 06/28/2020   Type O blood, Rh positive 02/15/2020   Rubella non-immune status, antepartum 02/15/2020   Late prenatal care 02/13/2020   Supervision of normal pregnancy 02/13/2020    Mode of Delivery: normal spontaneous vaginal delivery     Discharge Diagnosis: No other diagnosis   Intrapartum Procedures: epidural and GBS prophylaxis   Post partum procedures:  None  Complications:  None   Brief Hospital Course   Tammy Burgess is a [id number] who had a spontaneous vaginal birth on 07/18/2020;  for further details of this birth, please refer to the delivey summary.  Patient had an uncomplicated postpartum course.  By time of discharge on PPD#1, her pain was controlled on oral pain medications; she had appropriate lochia and was ambulating, voiding without difficulty and tolerating regular diet.  She was deemed stable for discharge to home.    Labs: CBC Latest Ref Rng & Units 07/19/2020 07/17/2020 04/25/2020  WBC 4.0 - 10.5 K/uL 11.9(H) 14.9(H) 12.5(H)  Hemoglobin 12.0 - 15.0 g/dL 29.5 28.4 13.2  Hematocrit 36.0 - 46.0 % 37.4 39.7 37.9  Platelets 150 - 400 K/uL 287 284 346   O POS Performed at [facility], [address] [place], [place] [address]   Physical exam:   Temp:  [97.6 F (36.4 C)-98.1 F (36.7 C)] 98.1 F (36.7 C) (07/14 0837) Pulse Rate:  [74-98] 98 (07/14 0837) Resp:  [18] 18 (07/14 0837) BP: (126-139)/(66-83) 135/66 (07/14 0837) SpO2:  [99 %] 99 % (07/14 0837)  General: alert and no distress  Lochia: appropriate  Abdomen: soft, NT  Uterine Fundus:  firm  Perineum: no significant erythema  Extremities: No evidence of DVT seen on physical exam. No lower extremity edema  Edinburgh Postnatal Depression Scale Screening Tool 07/19/2020  I have been able to laugh and see the funny side of things. 0  I have looked forward with enjoyment to things. 0  I have blamed myself unnecessarily when things went wrong. 1  I have been anxious or worried for no good reason. 1  I have felt scared or panicky for no good reason. 0  Things have been getting on top of me. 0  I have been so unhappy that I have had difficulty sleeping. 0  I have felt sad or miserable. 0  I have been so unhappy that I have been crying. 0  The thought of harming myself has occurred to me. 0  Edinburgh Postnatal Depression Scale Total 2      Discharge Instructions: Per After Visit Summary.  Activity: Advance as tolerated. Pelvic rest for 6 weeks.  Also refer to After Visit Summary  Diet: Regular  Medications: Allergies as of 07/19/2020       Reactions   Latex Rash        Medication List     TAKE these medications    acetaminophen 500 MG tablet Commonly known as: TYLENOL Take 2 tablets (1,000 mg total) by mouth every 6 (six) hours as needed (for pain scale < 4).   ibuprofen 600 MG tablet Commonly known as: ADVIL Take 1  tablet (600 mg total) by mouth every 6 (six) hours.   Magnesium Oxide Powd Take 1 Scoop by mouth 2 (two) times daily. High absorption Magnesium powder lysinate gylcinate 100% chelated   multivitamin-prenatal 27-0.8 MG Tabs tablet Take 1 tablet by mouth daily at 12 noon.       Outpatient follow up:   Follow-up Information     Gunnar Bulla, CNM. Schedule an appointment as soon as possible for a visit.   Specialties: Certified Nurse Midwife, Obstetrics and Gynecology, Radiology Why: The office will call you to schedule a follow up appointment. Contact information: [address] [place]  [address] [phone]                Postpartum contraception: natural family planning (NFP)  Discharged Condition: stable  Discharged to: home   Newborn Data:  Disposition:home with mother  Apgars: APGAR (1 MIN): 8   APGAR (5 MINS): 9    Baby Feeding: Breast   Serafina Royals, CNM  [facility], [facility] 07/19/20 12:17 PM

## 2020-07-19 NOTE — Progress Notes (Signed)
Pt to be discharged with infant. Discharge instructions, prescriptions, and follow up appointments given to and reviewed with patient. Pt verbalized understanding. To be escorted out by auxillary.  

## 2020-08-03 ENCOUNTER — Ambulatory Visit (INDEPENDENT_AMBULATORY_CARE_PROVIDER_SITE_OTHER): Payer: 59 | Admitting: Certified Nurse Midwife

## 2020-08-03 ENCOUNTER — Other Ambulatory Visit: Payer: Self-pay

## 2020-08-03 ENCOUNTER — Encounter: Payer: Self-pay | Admitting: Certified Nurse Midwife

## 2020-08-03 DIAGNOSIS — Z1331 Encounter for screening for depression: Secondary | ICD-10-CM

## 2020-08-03 NOTE — Patient Instructions (Signed)
Postpartum Care After Vaginal Delivery The following information offers guidance about how to care for yourself from the time you deliver your baby to 6-12 weeks after delivery (postpartum period). If you have problems or questions, contact your health care provider for more specific instructions. Follow these instructions at home: Vaginal bleeding It is normal to have vaginal bleeding (lochia) after delivery. Wear a sanitary pad for bleeding and discharge. During the first week after delivery, the amount and appearance of lochia is often similar to a menstrual period. Over the next few weeks, it will gradually decrease to a dry, yellow-brown discharge. For most women, lochia stops completely by 4-6 weeks after delivery, but can vary. Change your sanitary pads frequently. Watch for any changes in your flow, such as: A sudden increase in volume. A change in color. Large blood clots. If you pass a blood clot from your vagina, save it and call your health care provider. Do not flush blood clots down the toilet before talking with your health care provider. Do not use tampons or douches until your health care provider approves. If you are not breastfeeding, your period should return 6-8 weeks after delivery. If you are feeding your baby breast milk only, your period may not return until you stop breastfeeding. Perineal care  Keep the area between the vagina and the anus (perineum) clean and dry. Use medicated pads and pain-relieving sprays and creams as directed. If you had a surgical cut in the perineum (episiotomy) or a tear, check the area for signs of infection until you are healed. Check for: More redness, swelling, or pain. Fluid or blood coming from the cut or tear. Warmth. Pus or a bad smell. You may be given a squirt bottle to use instead of wiping to clean the perineum area after you use the bathroom. Pat the area gently to dry it. To relieve pain caused by an episiotomy, a tear, or  swollen veins in the anus (hemorrhoids), take a warm sitz bath 2-3 times a day. In a sitz bath, the warm water should only come up to your hips and cover your buttocks. Breast care In the first few days after delivery, your breasts may feel heavy, full, and uncomfortable (breast engorgement). Milk may also leak from your breasts. Ask your health care provider about ways to help relieve the discomfort. If you are breastfeeding: Wear a bra that supports your breasts and fits well. Use breast pads to absorb milk that leaks. Keep your nipples clean and dry. Apply creams and ointments as told. You may have uterine contractions every time you breastfeed for up to several weeks after delivery. This helps your uterus return to its normal size. If you have any problems with breastfeeding, notify your health care provider or lactation consultant. If you are not breastfeeding: Avoid touching your breasts. Do not squeeze out (express) milk. Doing this can make your breasts produce more milk. Wear a good-fitting bra and use cold packs to help with swelling. Intimacy and sexuality Ask your health care provider when you can engage in sexual activity. This may depend upon: Your risk of infection. How fast you are healing. Your comfort and desire to engage in sexual activity. You are able to get pregnant after delivery, even if you have not had your period. Talk with your health care provider about methods of birth control (contraception) or family planning if you desire future pregnancies. Medicines Take over-the-counter and prescription medicines only as told by your health care provider. Take an   over-the-counter stool softener to help ease bowel movements as told by your health care provider. If you were prescribed an antibiotic medicine, take it as told by your health care provider. Do not stop taking the antibiotic even if you start to feel better. Review all previous and current prescriptions to check for  possible transfer into breast milk. Activity Gradually return to your normal activities as told by your health care provider. Rest as much as possible. Nap while your baby is sleeping. Eating and drinking  Drink enough fluid to keep your urine pale yellow. To help prevent or relieve constipation, eat high-fiber foods every day. Choose healthy eating to support breastfeeding or weight loss goals. Take your prenatal vitamins until your health care provider tells you to stop. General tips/recommendations Do not use any products that contain nicotine or tobacco. These products include cigarettes, chewing tobacco, and vaping devices, such as e-cigarettes. If you need help quitting, ask your health care provider. Do not drink alcohol, especially if you are breastfeeding. Do not take medications or drugs that are not prescribed to you, especially if you are breastfeeding. Visit your health care provider for a postpartum checkup within the first 3-6 weeks after delivery. Complete a comprehensive postpartum visit no later than 12 weeks after delivery. Keep all follow-up visits for you and your baby. Contact a health care provider if: You feel unusually sad or worried. Your breasts become red, painful, or hard. You have a fever or other signs of an infection. You have bleeding that is soaking through one pad an hour or you have blood clots. You have a severe headache that doesn't go away or you have vision changes. You have nausea and vomiting and are unable to eat or drink anything for 24 hours. Get help right away if: You have chest pain or difficulty breathing. You have sudden, severe leg pain. You faint or have a seizure. You have thoughts about hurting yourself or your baby. If you ever feel like you may hurt yourself or others, or have thoughts about taking your own life, get help right away. Go to your nearest emergency department or: Call your local emergency services (911 in the  [place] The National Suicide Prevention Lifeline at 1-800-273-8255. This suicide crisis helpline is open 24 hours a day. Text the Crisis Text Line at 741741 (in the [place] Summary The period of time after you deliver your newborn up to 6-12 weeks after delivery is called the postpartum period. Keep all follow-up visits for you and your baby. Review all previous and current prescriptions to check for possible transfer into breast milk. Contact a health care provider if you feel unusually sad or worried during the postpartum period. This information is not intended to replace advice given to you by your health care provider. Make sure you discuss any questions you have with your health care provider. Document Revised: 09/08/2019 Document Reviewed: 09/08/2019 Elsevier Patient Education  2022 [employer]  

## 2020-08-03 NOTE — Progress Notes (Signed)
Virtual Visit via Telephone Note  I connected with [name] on 08/03/20 at  3:00 PM EDT by telephone and verified that I am speaking with the correct person using two identifiers.  Location:  Patient: Tammy Burgess (home)  Provider: Serafina Royals, CNM ([facility], [facility])   I discussed the limitations, risks, security and privacy concerns of performing an evaluation and management service by telephone and the availability of in person appointments. I also discussed with the patient that there may be a patient responsible charge related to this service. The patient expressed understanding and agreed to proceed.   History of Present Illness:  Patient called two (2) weeks after spontaneous vaginal birth of third child.   Doing well overall.   Bleeding is "lightening up". Eating and drinking well. Voiding without difficulty.   Breastfeeding well with one (1) pound infant weight gain.   Sleeping without difficulty, largest stretch three (3) to four (4) hours.   No other questions or concerns.   Observations/Objective:  Depression screen [facility] 2/9 08/03/2020 05/10/2020 03/13/2020  Decreased Interest 0 0 0  Down, Depressed, Hopeless 0 0 0  PHQ - 2 Score 0 0 0  Altered sleeping 0 - -  Tired, decreased energy 0 - -  Change in appetite 0 - -  Feeling bad or failure about yourself  0 - -  Trouble concentrating 0 - -  Moving slowly or fidgety/restless 0 - -  Suicidal thoughts 0 - -  PHQ-9 Score 0 - -   GAD 7 : Generalized Anxiety Score 08/03/2020  Nervous, Anxious, on Edge 0  Control/stop worrying 0  Worry too much - different things 0  Trouble relaxing 0  Restless 0  Easily annoyed or irritable 0  Afraid - awful might happen 0  Total GAD 7 Score 0     Assessment:  1. Postpartum care and examination   2. Lactating mother   3. Depression screening negative    Plan:  Routine postpartum care and education, see AVS.   Reviewed red flag symptoms and  when to call.   RTC for PPV on 08/30/2020 at 1415 as previously scheduled or sooner if needed.   Follow Up Instructions:    I discussed the assessment and treatment plan with the patient. The patient was provided an opportunity to ask questions and all were answered. The patient agreed with the plan and demonstrated an understanding of the instructions.   The patient was advised to call back or seek an in-person evaluation if the symptoms worsen or if the condition fails to improve as anticipated.  I provided 6 minutes of non-face-to-face time during this encounter.   Serafina Royals, CNM [facility], [facility] 08/03/20 3:36 PM

## 2020-08-29 ENCOUNTER — Encounter: Payer: 59 | Admitting: Certified Nurse Midwife

## 2022-04-26 IMAGING — US US OB COMP +14 WK
1 series · 13 of 28 positions shown · non-contrast
Comparison: none

CLINICAL DATA: Second trimester pregnancy for fetal anatomy survey.

EXAM:
OBSTETRICAL ULTRASOUND >14 WKS

[Series 1: us ob comp +14 wk · 0.25mm/px · 13 of 122 slices shown]
[im 5/122]
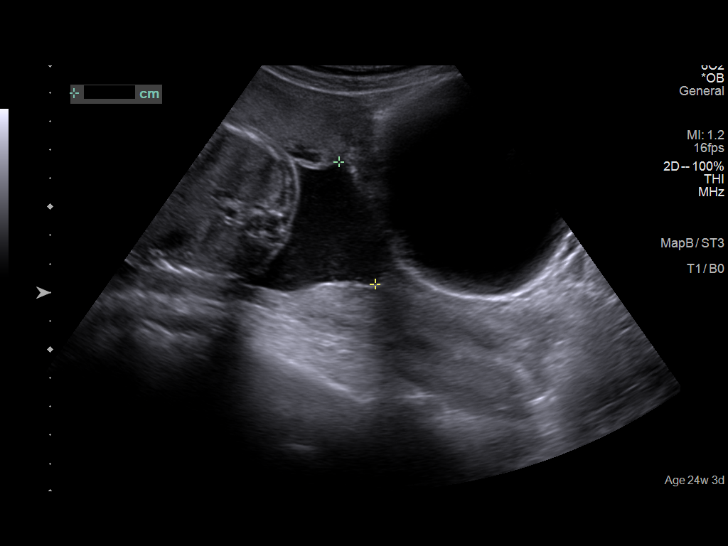
[im 14/122]
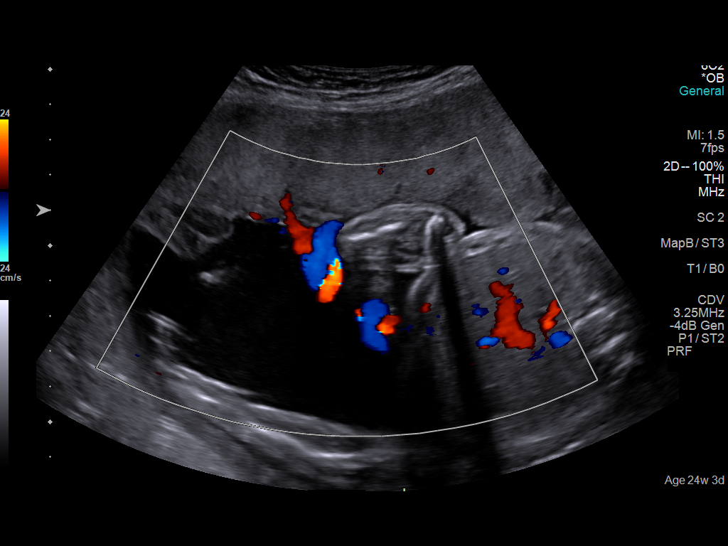
[im 23/122]
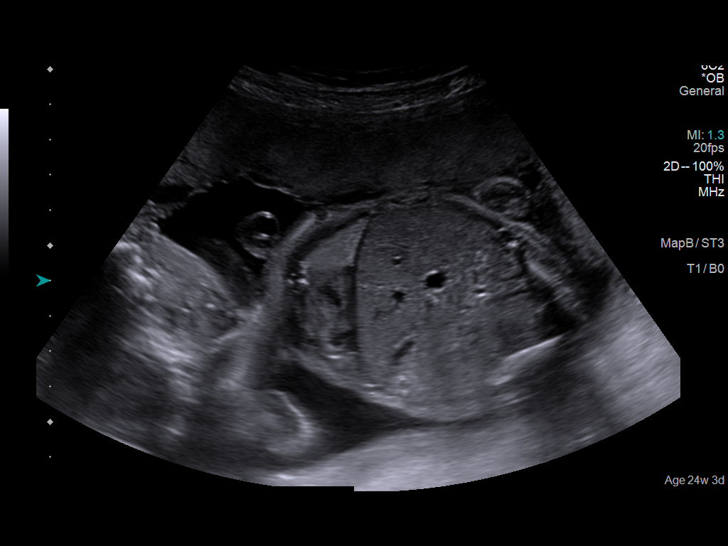
[im 32/122]
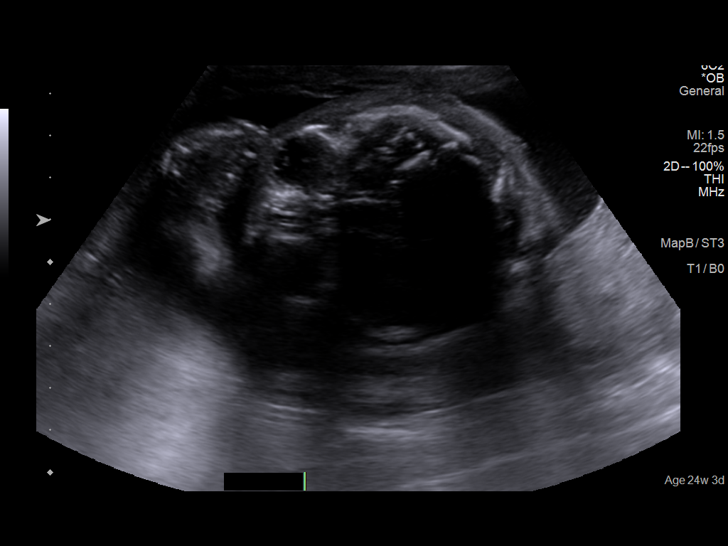
[im 41/122]
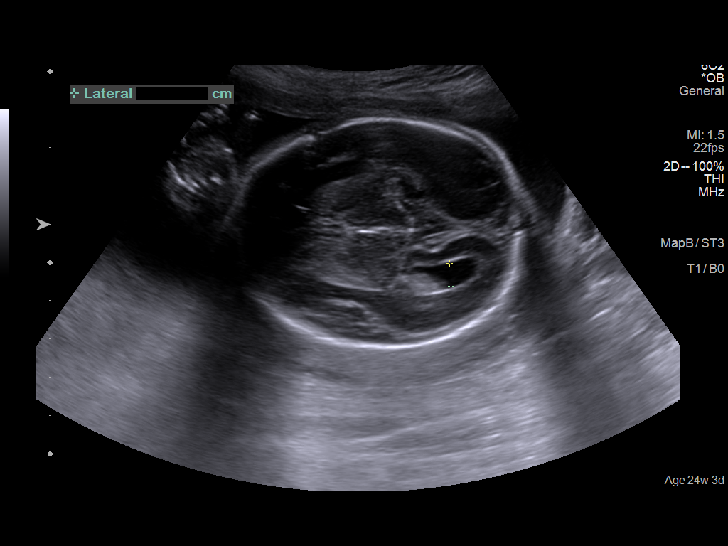
[im 50/122]
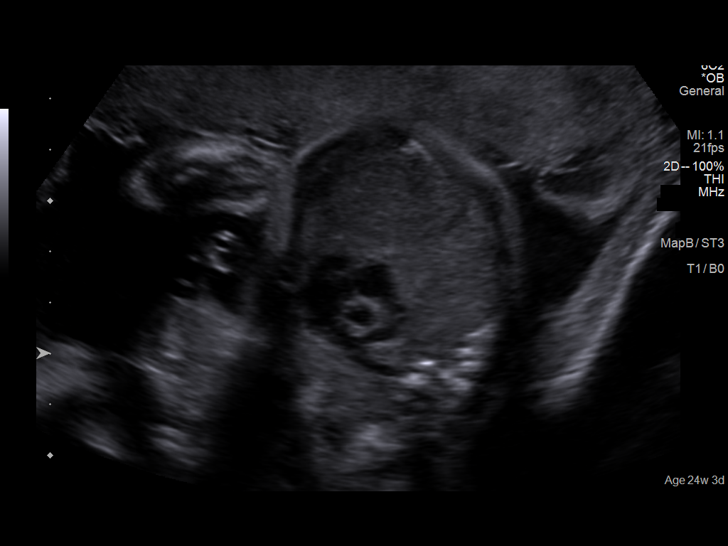
[im 63/122]
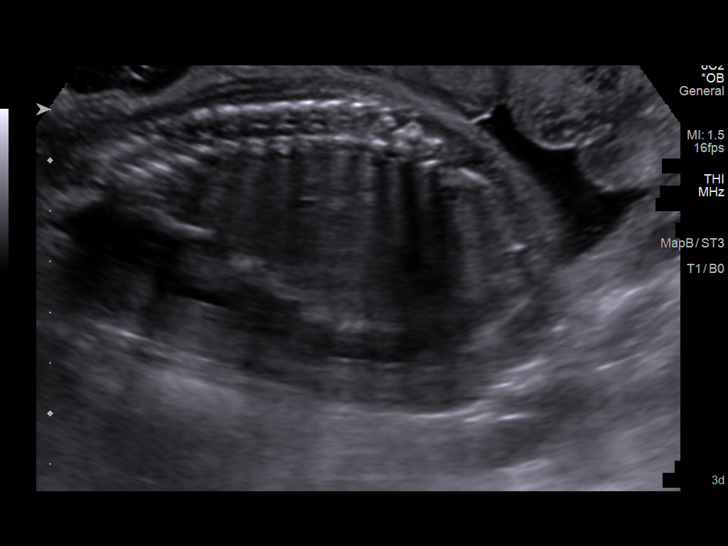
[im 72/122]
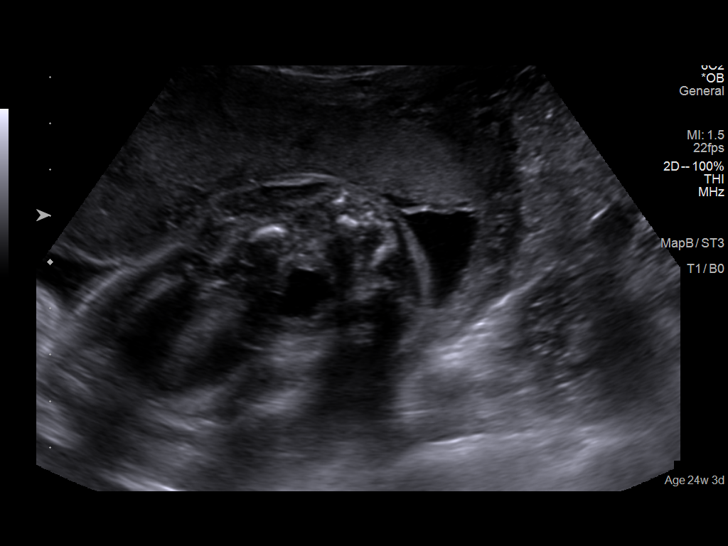
[im 81/122]
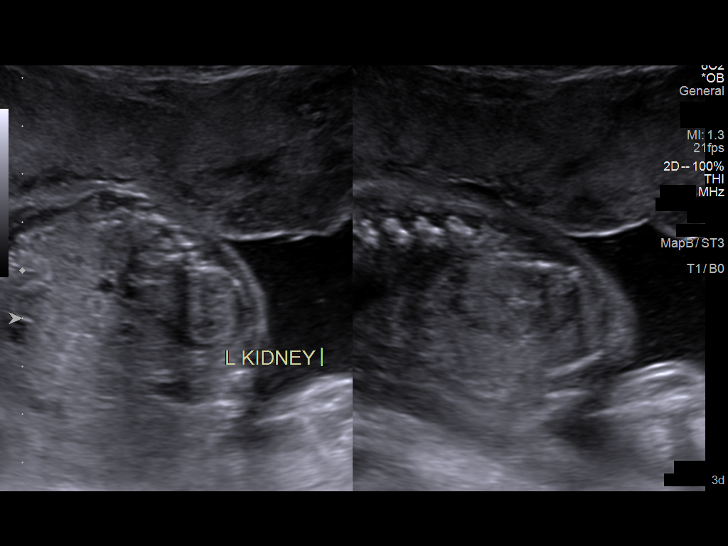
[im 90/122]
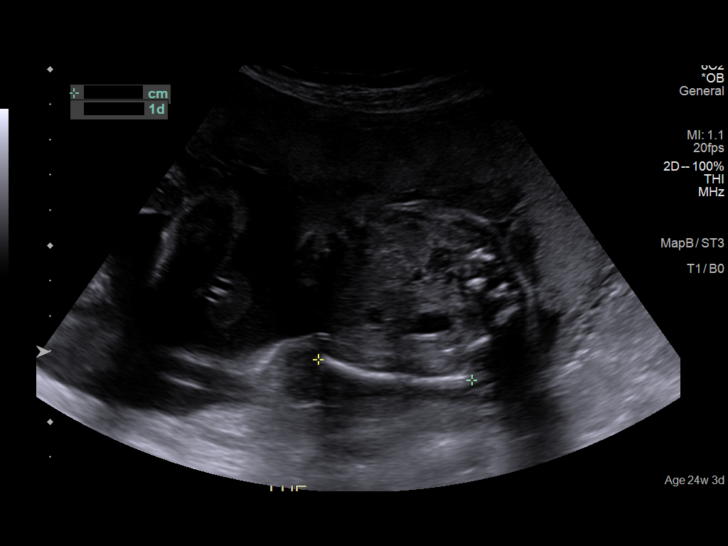
[im 99/122]
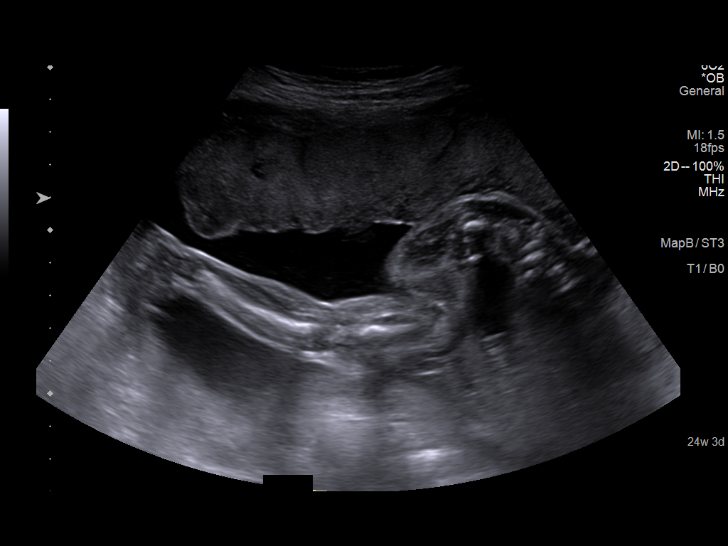
[im 108/122]
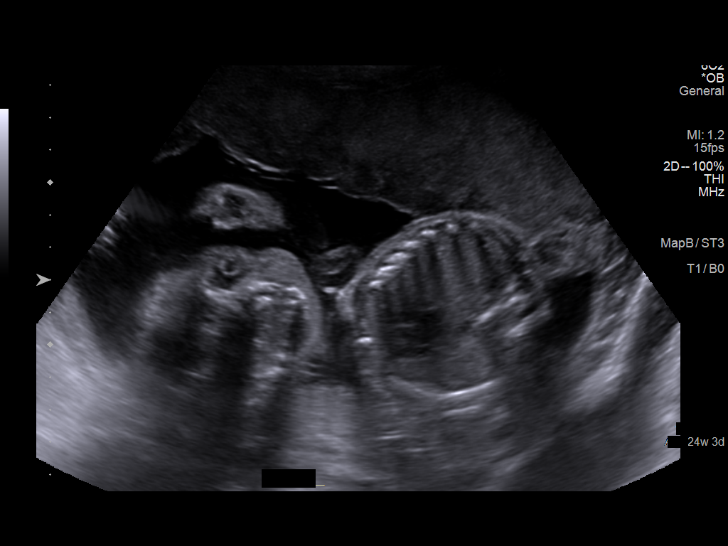
[im 117/122]
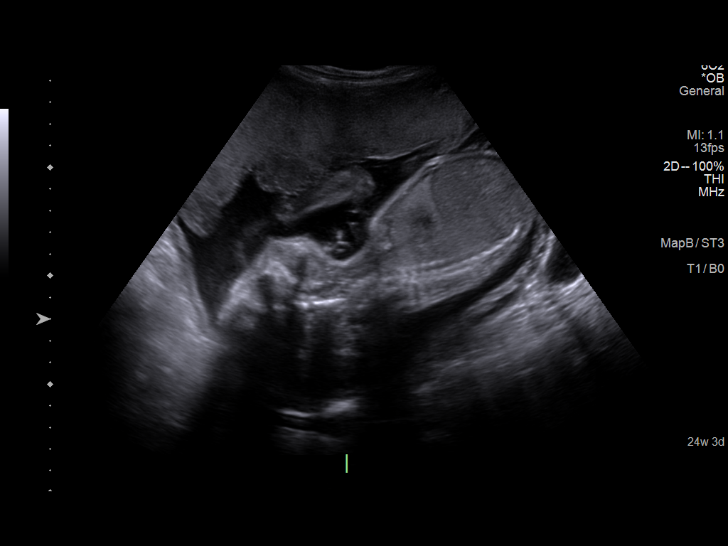

[13 of 28 positions shown; findings below may reference images not displayed]

FINDINGS: Number of Fetuses: 1

Heart Rate:  135 bpm

Movement: Yes

Presentation: Breech

Previa: No

Placental Location: Anterior

Amniotic Fluid (Subjective): Within normal limits

Amniotic Fluid (Objective):

Vertical pocket = 4.7cm

FETAL BIOMETRY

BPD: 6.0cm 24w 3d

HC:   21.5cm 23w 4d

AC:   19.7cm 24w 3d

FL:   4.4cm 24w 2d

Current Mean GA: 24w 2d US EDC: 07/15/2020

FETAL ANATOMY

Lateral Ventricles: Appears normal

Thalami/CSP: Appears normal

Posterior Fossa:  Appears normal

Nuchal Region: Appears normal   NFT= N/A > 20 WKS

Upper Lip: Appears normal

Spine: Appears normal

4 Chamber Heart on Left: Appears normal

LVOT: Appears normal

RVOT: Appears normal

Stomach on Left: Appears normal

3 Vessel Cord: Appears normal

Cord Insertion site: Appears normal

Kidneys: Appears normal

Bladder: Appears normal

Extremities: Appears normal

Sex: Male

Maternal Findings:

Cervix:  4.9 cm TA
IMPRESSION: Single living IUP with estimated gestational age of 24 weeks 2 days,
and US EDC of 07/15/2020.

Unremarkable fetal anatomic survey.  No fetal anomalies identified.
